# Patient Record
Sex: Female | Born: 1965 | Race: White | Hispanic: No | Marital: Married | Smoking: Never smoker
Health system: Southern US, Community
[De-identification: ages and names within clinical notes are randomized; demographics above are authoritative.]

## PROBLEM LIST (undated history)

## (undated) DIAGNOSIS — Z87442 Personal history of urinary calculi: Secondary | ICD-10-CM

## (undated) DIAGNOSIS — R011 Cardiac murmur, unspecified: Secondary | ICD-10-CM

## (undated) DIAGNOSIS — Z973 Presence of spectacles and contact lenses: Secondary | ICD-10-CM

## (undated) DIAGNOSIS — N201 Calculus of ureter: Secondary | ICD-10-CM

## (undated) DIAGNOSIS — J309 Allergic rhinitis, unspecified: Secondary | ICD-10-CM

## (undated) HISTORY — PX: ACHILLES TENDON LENGTHENING: SUR826

---

## 2003-09-04 ENCOUNTER — Other Ambulatory Visit: Admission: RE | Admit: 2003-09-04 | Discharge: 2003-09-04 | Payer: Self-pay | Admitting: Obstetrics and Gynecology

## 2004-02-21 ENCOUNTER — Other Ambulatory Visit: Admission: RE | Admit: 2004-02-21 | Discharge: 2004-02-21 | Payer: Self-pay | Admitting: Obstetrics and Gynecology

## 2004-07-04 ENCOUNTER — Other Ambulatory Visit: Admission: RE | Admit: 2004-07-04 | Discharge: 2004-07-04 | Payer: Self-pay | Admitting: Obstetrics & Gynecology

## 2004-12-10 ENCOUNTER — Other Ambulatory Visit: Admission: RE | Admit: 2004-12-10 | Discharge: 2004-12-10 | Payer: Self-pay | Admitting: Obstetrics and Gynecology

## 2005-09-02 ENCOUNTER — Ambulatory Visit (HOSPITAL_COMMUNITY): Admission: RE | Admit: 2005-09-02 | Discharge: 2005-09-02 | Payer: Self-pay | Admitting: Obstetrics and Gynecology

## 2005-10-04 ENCOUNTER — Encounter (INDEPENDENT_AMBULATORY_CARE_PROVIDER_SITE_OTHER): Payer: Self-pay | Admitting: *Deleted

## 2005-10-04 ENCOUNTER — Inpatient Hospital Stay (HOSPITAL_COMMUNITY): Admission: AD | Admit: 2005-10-04 | Discharge: 2005-10-08 | Payer: Self-pay | Admitting: Obstetrics and Gynecology

## 2008-01-28 ENCOUNTER — Ambulatory Visit: Payer: Self-pay | Admitting: Internal Medicine

## 2008-01-28 DIAGNOSIS — K219 Gastro-esophageal reflux disease without esophagitis: Secondary | ICD-10-CM

## 2008-01-28 DIAGNOSIS — J309 Allergic rhinitis, unspecified: Secondary | ICD-10-CM | POA: Insufficient documentation

## 2008-01-28 DIAGNOSIS — Z87442 Personal history of urinary calculi: Secondary | ICD-10-CM

## 2008-11-14 ENCOUNTER — Ambulatory Visit (HOSPITAL_COMMUNITY): Admission: RE | Admit: 2008-11-14 | Discharge: 2008-11-14 | Payer: Self-pay | Admitting: Obstetrics and Gynecology

## 2009-03-20 ENCOUNTER — Encounter (INDEPENDENT_AMBULATORY_CARE_PROVIDER_SITE_OTHER): Payer: Self-pay | Admitting: Obstetrics and Gynecology

## 2009-03-20 ENCOUNTER — Inpatient Hospital Stay (HOSPITAL_COMMUNITY): Admission: RE | Admit: 2009-03-20 | Discharge: 2009-03-23 | Payer: Self-pay | Admitting: Obstetrics and Gynecology

## 2009-08-09 ENCOUNTER — Ambulatory Visit: Payer: Self-pay | Admitting: Internal Medicine

## 2009-08-09 DIAGNOSIS — N390 Urinary tract infection, site not specified: Secondary | ICD-10-CM | POA: Insufficient documentation

## 2009-08-09 LAB — CONVERTED CEMR LAB
Glucose, Urine, Semiquant: NEGATIVE
Ketones, urine, test strip: NEGATIVE
Nitrite: NEGATIVE
Protein, U semiquant: 30
Specific Gravity, Urine: 1.02
Urobilinogen, UA: 0.2
pH: 5

## 2009-08-10 ENCOUNTER — Telehealth: Payer: Self-pay | Admitting: Internal Medicine

## 2009-08-10 DIAGNOSIS — R319 Hematuria, unspecified: Secondary | ICD-10-CM | POA: Insufficient documentation

## 2009-08-14 ENCOUNTER — Telehealth: Payer: Self-pay | Admitting: Internal Medicine

## 2009-08-14 ENCOUNTER — Ambulatory Visit: Payer: Self-pay | Admitting: Cardiology

## 2010-02-20 NOTE — Progress Notes (Signed)
Summary: Kidney stone?  Phone Note Call from Patient   Caller: Spouse [phone] Summary of Call: Pt's spouse called stating that since pt has started ABX she has been experiencing severe abd and lower back pain. Spouse is unsure of pain is related to ABX of if pt may have a possible kidney stone. Per spouse pt does not have a fever, no N&V or diarrhea. Pt's husband is requesting MD's advise, change or ABX or kidney stone eval? Initial call taken by: Margaret Pyle, CMA,  August 10, 2009 11:41 AM  Follow-up for Phone Call        stone eval- CT haas been ordered Follow-up by: Etta Grandchild MD,  August 10, 2009 11:43 AM  Additional Follow-up for Phone Call Additional follow up Details #1::        Pt and spouse advised but is still requesting an alternate ABX. Pt states that it is causing GI upset that is making back pain worse. Pt says she has a significant Hx of kidney stones, pt also wanted MD to know that she is using pain meds she had left over from C-Section in 03/2009 (Percocet). [facility] Additional Follow-up by: Margaret Pyle, CMA,  August 10, 2009 11:56 AM  New Problems: HEMATURIA UNSPECIFIED (ICD-599.70)   Additional Follow-up for Phone Call Additional follow up Details #2::    ok Follow-up by: Etta Grandchild MD,  August 10, 2009 12:23 PM  Additional Follow-up for Phone Call Additional follow up Details #3:: Details for Additional Follow-up Action Taken: Spouse informed Additional Follow-up by: Margaret Pyle, CMA,  August 10, 2009 1:44 PM  New Problems: HEMATURIA UNSPECIFIED (ICD-599.70) New/Updated Medications: MACROBID 100 MG CAPS (NITROFURANTOIN MONOHYD MACRO) One by mouth two times a day for 7 days Prescriptions: MACROBID 100 MG CAPS (NITROFURANTOIN MONOHYD MACRO) One by mouth two times a day for 7 days  #14 x 1   Entered and Authorized by:   Etta Grandchild MD   Signed by:   Etta Grandchild MD on 08/10/2009   Method used:   Electronically to          [id number] (retail)       [address]       [place], [place]  [address]       Ph: [phone] or [phone]       Fax: [phone]   RxID:   [id number]

## 2010-02-20 NOTE — Progress Notes (Signed)
Summary: REQ FOR PAIN MED RX - CT RESULTS  Phone Note Call from Patient Call back at Home Phone [phone]   Summary of Call: Patient is requesting rx for pain. Had CT today, please review. Pt had some percocet left over from C-section in March but states that has not given much relief from pain. Pt was given antibiotic on Friday.   Initial call taken by: Lamar Sprinkles, CMA,  August 14, 2009 1:39 PM  Follow-up for Phone Call        Dr Debby Bud informed of CT results. Ok ketoralac 10mg  1 three times a day #15. Pt informed of rx.  Follow-up by: Lamar Sprinkles, CMA,  August 14, 2009 1:59 PM  Additional Follow-up for Phone Call Additional follow up Details #1::        k Additional Follow-up by: Jacques Navy MD,  August 14, 2009 2:21 PM    New/Updated Medications: KETOROLAC TROMETHAMINE 10 MG TABS (KETOROLAC TROMETHAMINE) 1 three times a day as needed pain Prescriptions: KETOROLAC TROMETHAMINE 10 MG TABS (KETOROLAC TROMETHAMINE) 1 three times a day as needed pain  #15 x 0   Entered by:   Lamar Sprinkles, CMA   Authorized by:   Jacques Navy MD   Signed by:   Lamar Sprinkles, CMA on 08/14/2009   Method used:   Electronically to        [id number] (retail)       [address]       [place], [place]  [address]       Ph: [phone] or [phone]       Fax: [phone]   RxID:   [id number]

## 2010-02-20 NOTE — Assessment & Plan Note (Signed)
Summary: BURNING-ITCH WHEN URINATE  STC   Vital Signs:  Patient profile:   45 year old female Height:      63.5 inches Weight:      102 pounds BMI:     17.85 O2 Sat:      98 % on Room air Temp:     97.3 degrees F oral Pulse rate:   99 / minute Pulse rhythm:   regular Resp:     16 per minute BP sitting:   112 / 68  (left arm) Cuff size:   regular  Vitals Entered By: Rock Nephew CMA (August 09, 2009 1:47 PM)  O2 Flow:  Room air CC: urinary burning and itching w/ recent low grade fever x 3days, Dysuria Is Patient Diabetic? No Pain Assessment Patient in pain? no        Primary Care Provider:  Etta Grandchild MD  CC:  urinary burning and itching w/ recent low grade fever x 3days and Dysuria.  History of Present Illness:  Dysuria      This is a 45 year old woman who presents with Dysuria.  The symptoms began 3 days ago.  The intensity is described as moderate.  The patient reports burning with urination, urinary frequency, urgency, and hematuria, but denies vaginal discharge, vaginal itching, and vaginal sores.  Associated symptoms include pelvic pain.  The patient denies the following associated symptoms: nausea, vomiting, fever, shaking chills, flank pain, abdominal pain, back pain, and arthralgias.    Preventive Screening-Counseling & Management  Alcohol-Tobacco     Alcohol drinks/day: 0     Smoking Status: never     Passive Smoke Exposure: no  Hep-HIV-STD-Contraception     Hepatitis Risk: no risk noted     HIV Risk: no risk noted     STD Risk: no risk noted      Drug Use:  no.    Medications Prior to Update: 1)  Folic Acid .... Take 1 Tablet By Mouth Once A Day 2)  Omnaris 50 Mcg/act Susp (Ciclesonide) .... 2 Puffs Each Nostril Once Daily 3)  Xyzal 5 Mg Tabs (Levocetirizine Dihydrochloride) .... Once Daily  Current Medications (verified): 1)  Omnaris 50 Mcg/act Susp (Ciclesonide) .... 2 Puffs Each Nostril Once Daily 2)  Cipro 250 Mg Tab (Ciprofloxacin Hcl)  .... Take 1 Tablet By Mouth Morning and Night X 7 Days  Allergies (verified): 1)  ! Keflex  Past History:  Past Medical History: Last updated: 01/28/2008 Allergic rhinitis Mitral regurg.  murmur GERD Nephrolithiasis, hx of  Family History: Last updated: 01/28/2008 Family History of Arthritis Family History Breast cancer 1st degree relative <50 Family History Hypertension  Social History: Last updated: 01/28/2008 Occupation: Runner, broadcasting/film/video Married Never Smoked Alcohol use-no Drug use-no Regular exercise-yes  Risk Factors: Alcohol Use: 0 (08/09/2009) Exercise: yes (01/28/2008)  Risk Factors: Smoking Status: never (08/09/2009) Passive Smoke Exposure: no (08/09/2009)  Past Surgical History: Caesarean section Tubal ligation  Family History: Reviewed history from 01/28/2008 and no changes required. Family History of Arthritis Family History Breast cancer 1st degree relative <50 Family History Hypertension  Social History: Reviewed history from 01/28/2008 and no changes required. Occupation: Runner, broadcasting/film/video Married Never Smoked Alcohol use-no Drug use-no Regular exercise-yes Hepatitis Risk:  no risk noted HIV Risk:  no risk noted STD Risk:  no risk noted  Review of Systems       The patient complains of hematuria.  The patient denies anorexia, fever, weight loss, chest pain, syncope, suspicious skin lesions, and enlarged lymph  nodes.    Physical Exam  General:  alert, well-developed, well-nourished, and well-hydrated.   Eyes:  vision grossly intact and no injection.   Mouth:  Oral mucosa and oropharynx without lesions or exudates.  Teeth in good repair. Neck:  supple, full ROM, and no masses.   Lungs:  Normal respiratory effort, chest expands symmetrically. Lungs are clear to auscultation, no crackles or wheezes. Heart:  Normal rate and regular rhythm. S1 and S2 normal without gallop, murmur, click, rub or other extra sounds. Abdomen:  soft, non-tender, normal bowel  sounds, no distention, no masses, no guarding, no rigidity, no rebound tenderness, no abdominal hernia, no inguinal hernia, no hepatomegaly, and no splenomegaly.  No CVAT. Msk:  normal ROM, no joint tenderness, no joint swelling, no joint warmth, no redness over joints, no joint deformities, no joint instability, no crepitation, and no muscle atrophy.   Pulses:  R and L carotid,radial,femoral,dorsalis pedis and posterior tibial pulses are full and equal bilaterally Extremities:  No clubbing, cyanosis, edema, or deformity noted with normal full range of motion of all joints.   Neurologic:  No cranial nerve deficits noted. Station and gait are normal. Plantar reflexes are down-going bilaterally. DTRs are symmetrical throughout. Sensory, motor and coordinative functions appear intact. Skin:  turgor normal, color normal, no rashes, no suspicious lesions, no ecchymoses, no petechiae, no purpura, no ulcerations, and no edema.   Cervical Nodes:  no anterior cervical adenopathy and no posterior cervical adenopathy.   Inguinal Nodes:  no R inguinal adenopathy and no L inguinal adenopathy.   Psych:  Cognition and judgment appear intact. Alert and cooperative with normal attention span and concentration. No apparent delusions, illusions, hallucinations   Impression & Recommendations:  Problem # 1:  UTI (ICD-599.0) Assessment New  Her updated medication list for this problem includes:    Cipro 250 Mg Tab (Ciprofloxacin hcl) .[name] Kitchen... Take 1 tablet by mouth morning and night x 7 days  Encouraged to push clear liquids, get enough rest, and take acetaminophen as needed. To be seen in 10 days if no improvement, sooner if worse.  Orders: UA Dipstick w/o Micro (manual) (40981)  Complete Medication List: 1)  Omnaris 50 Mcg/act Susp (Ciclesonide) .... 2 puffs each nostril once daily 2)  Cipro 250 Mg Tab (Ciprofloxacin hcl) .... Take 1 tablet by mouth morning and night x 7 days  Patient Instructions: 1)  Please  schedule a follow-up appointment in 2 weeks. 2)  Take your antibiotic as prescribed until ALL of it is gone, but stop if you develop a rash or swelling and contact our office as soon as possible. 3)  Drink as much fluid as you can tolerate for the next few days. Prescriptions: CIPRO 250 MG TAB (CIPROFLOXACIN HCL) Take 1 tablet by mouth morning and night X 7 days  #14 x 0   Entered and Authorized by:   Etta Grandchild MD   Signed by:   Etta Grandchild MD on 08/09/2009   Method used:   Electronically to        [id number] (retail)       [address]       [place], [place]  [address]       Ph: [phone] or [phone]       Fax: [phone]   RxID:   [id number]   Preventive Care Screening  Pap Smear:    Date:  04/20/2009    Results:  normal   Mammogram:    Date:  03/20/2008    Results:  normal   Last Tetanus Booster:    Date:  01/20/2001    Results:  Historical   Laboratory Results   Urine Tests  Date/Time Received: Rock Nephew CMA  August 09, 2009 2:06 PM   Routine Urinalysis   Color: orange Appearance: Hazy Glucose: negative   (Normal Range: Negative) Bilirubin: negative   (Normal Range: Negative) Ketone: negative   (Normal Range: Negative) Spec. Gravity: 1.020   (Normal Range: 1.003-1.035) Blood: large   (Normal Range: Negative) pH: 5.0   (Normal Range: 5.0-8.0) Protein: 30   (Normal Range: Negative) Urobilinogen: 0.2   (Normal Range: 0-1) Nitrite: negative   (Normal Range: Negative) Leukocyte Esterace: moderate   (Normal Range: Negative)

## 2010-04-14 LAB — CBC
HCT: 29.8 % — ABNORMAL LOW (ref 36.0–46.0)
Hemoglobin: 7.7 g/dL — ABNORMAL LOW (ref 12.0–15.0)
MCHC: 33 g/dL (ref 30.0–36.0)
MCV: 90.3 fL (ref 78.0–100.0)
Platelets: 244 10*3/uL (ref 150–400)
RDW: 14.2 % (ref 11.5–15.5)
RDW: 14.4 % (ref 11.5–15.5)

## 2010-04-14 LAB — CCBB MATERNAL DONOR DRAW

## 2010-06-07 NOTE — Op Note (Signed)
[name], [name]               ACCOUNT NO.:  [id number]   MEDICAL RECORD NO.:  [id number]          PATIENT TYPE:  INP   LOCATION:  9147                          F[facility]ILITY[facility]   PHYSICIAN:  [name], M.D. DATE OF BIRTH:  1965/07/26   DATE OF PROCEDURE:  10/04/2005  DATE OF DISCHARGE:                                 OPERATIVE REPORT   PREOPERATIVE DIAGNOSIS:  Suspect abruption.   POSTOPERATIVE DIAGNOSIS:  Probable abruption.   PROCEDURE:  Primary low transverse cesarean section.   ATTENDING:  Carrington Reynolds, M.D.   ANESTHESIA:  Epidural.   ESTIMATED BLOOD LOSS:  700 mL.   IV FLUIDS:  1000 mL.   URINE OUTPUT:  100 mL.   FINDINGS:  Baby in direct OP presentation with small clots of blood around  the baby's face on entry to the amnion.  There was no evidence of a clotted  surface of the placenta, although there was a small amount of clot and  bleeding in the posterior portion of the uterus close to the cervix.  Baby's  Apgars were 9 and 9.  Cord pH was 7.26.   COMPLICATIONS:  None.   MEDICATIONS:  Pitocin.   PATHOLOGY:  Placenta.   COUNTS:  Correct x3.   REASON FOR OPERATION:  [name] was 39+ weeks and 2 cm dilated and 80%  and desired elective induction at term secondary to increasing pain.  The  patient had not had rupture of membranes and the baby was moving well.  The  patient was admitted to the hospital on Saturday evening and Pitocin was  started after AROM.  At AROM there was port wine bloody fluid noted and I  asked the nurse to keep an eye on this afterwards.  It is unsure if it ever  had cleared, but the fetal heart rates were stable and had good reactivity.  During the course the induction the patient complained of increased pain and  received her epidural, had dropped her blood pressure at that time.  It was  thought to be secondary to the epidural and stabilized with ephedrine.  At  that time bright red blood mixed with port wine fluid  was noted.  This  continued for about an hour.  Later I discussed with the patient the  possibility of doing a cesarean despite the fact that the fetal heart tones  were stable.  The patient desired to wait 30 minutes and 30 minutes later,  the bloody fluid and bleeding continued.  The patient was taken the  operating room for suspected abruption.   TECHNIQUE:  After adequate epidural anesthesia was achieved, the patient was  prepped and draped in the usual sterile fashion in dorsal supine position  with leftward tilt.  A Pfannenstiel skin incision was made with the scalpel  and carried down to the fascia with the Bovie cautery.  The fascia was  incised in the midline with the scalpel and then incised in a transverse  curvilinear manner with the Mayo scissors.  The fascia was reflected  superiorly and inferiorly off the rectus muscles.  The  rectus muscles were  split in the midline.  A bowel-free portion of peritoneum was entered into  and then divided bluntly.  A bladder blade was placed and the vesicouterine  fascia tented up and incised in a transverse curvilinear manner with blunt  dissection, creating the bladder flap.  The bladder blade were placed and a  2 cm incision was made in the upper portion of the lower uterine segment  transversely.  On entry into the amnion, small blood clots were noted around  the baby's face and the incision was extended with the bandage scissors in a  transverse curvilinear manner.  The baby was identified in the vertex  presentation and delivered without complication.  The baby was bulb-  suctioned and the cord was clamped and cut and the baby was handed to a  waiting pediatrics.  Cord gases and cord bloods were obtained and the  placenta was delivered manually.   The uterus was exteriorized, wrapped in a wet lap and cleared of all debris.  The uterine incision was closed with a running locked stitch of 0 Monocryl.  An imbricating layer of 0 Monocryl  was then performed.  An additional figure-  of-eight stitch was used to ensure hemostasis.  The uterus as reapproximated  in the abdomen and had been cleared of all debris with irrigation.  The  uterine incision was reinspected and found to be hemostatic..  The  peritoneum was closed with a running stitch of 2-0 Vicryl.  The fascia was  closed with a running stitch of 0 Vicryl.  The subcutaneous tissue was  rendered hemostatic with Bovie cautery and the skin was closed with staples.  The patient tolerated the procedure well and was returned to the recovery  room in stable condition.   ADDENDUM:  The patient did not receive intraoperative antibiotics as she had  received ampicillin 4 hours before for mitral valve prolapse and she was  allergic to [name].      [name], M.D.  Electronically Signed     MH/MEDQ  D:  10/04/2005  T:  10/06/2005  Job:  [id number]

## 2010-06-07 NOTE — Discharge Summary (Signed)
[name], [name]               ACCOUNT NO.:  [id number]   MEDICAL RECORD NO.:  [id number]          PATIENT TYPE:  INP   LOCATION:  9147                          [facility]ACILITY:  [facility]   PHYSICIAN:  Miguel Aschoff, M.D.       DATE OF BIRTH:  10/18/1965   DATE OF ADMISSION:  10/04/2005  DATE OF DISCHARGE:  10/08/2005                                 DISCHARGE SUMMARY   FINAL DIAGNOSES:  1. Intrauterine pregnancy at [redacted] weeks gestation.  2. Elective induction.  3. Probable abruption.   PROCEDURE:  Primary low transverse cesarean section.  Surgeon Dr. Carrington Clamp.  Complications none.   HISTORY OF PRESENT ILLNESS:  This 45 year old G1, P0 presents at [redacted] weeks  gestation complaining of pain, and she was admitted for an elective  induction.  Her antepartum course up to this point had been complicated by  advanced maternal age of the father of the baby was greater than 50, and the  patient was also 51.  She had a normal quad screen.  Never had  amniocentesis.  The patient also has mitral valve prolapse and does receive  antibiotics during any dental procedures.  The patient is also nonimmune to  rubella, which she will need to be vaccinated for postpartum.   Upon admission, the patient started to have some increased bleeding that was  active and heavier than her period.  Fetal heart tones were still nice and  reactive, and she was about 3 cm dilated at this time.  A discussion was  held with the patient by Dr. Carrington Clamp.  A decision was made to  proceed with a cesarean section secondary to probable abruption.  The  patient was taken to the  operating room on October 04, 2005 by Dr.  Carrington Clamp where a primary low transverse cesarean section was  performed with the delivery of a 6 pound 8 ounces female infant without  Apgars of 9 and 9.  Cord pH was 7.26.  There were some small clots of blood  around the baby's face on entry to the amnion, but there is no evidence of  clot at  the surface of the placenta.   The patient's postoperative course was benign without any significant  fevers.  She was given some antihistamines, nasal steroids for a cold.  She  did have a postoperative anemia that dropped to 9.1 and was started on iron.  She was felt ready for discharge on postoperative day #3.  She was sent home  on a regular diet, told to decrease her activities, told to continue her  prenatal vitamins and iron supplement, was given Percocet 1-2 every 4 hours  as needed for pain, and was to follow up in our office in 2 weeks or sooner  if there was any increased bleeding, pain or problems.   LABORATORIES ON DISCHARGE:  The patient had a hemoglobin of 8.7, white blood  cell count of 11.9 and platelets of 242,000.      Leilani Able, P.A.-C.      Miguel Aschoff, M.D.  Electronically Signed  MB/MEDQ  D:  10/29/2005  T:  10/31/2005  Job:  [id number]

## 2010-11-13 ENCOUNTER — Other Ambulatory Visit: Payer: Self-pay | Admitting: Obstetrics and Gynecology

## 2011-02-07 ENCOUNTER — Ambulatory Visit (INDEPENDENT_AMBULATORY_CARE_PROVIDER_SITE_OTHER): Payer: BC Managed Care – PPO | Admitting: Internal Medicine

## 2011-02-07 ENCOUNTER — Ambulatory Visit (INDEPENDENT_AMBULATORY_CARE_PROVIDER_SITE_OTHER)
Admission: RE | Admit: 2011-02-07 | Discharge: 2011-02-07 | Disposition: A | Discharge status: Home / Self Care | Payer: BC Managed Care – PPO | Source: Ambulatory Visit | Attending: Internal Medicine | Admitting: Internal Medicine

## 2011-02-07 ENCOUNTER — Encounter: Payer: Self-pay | Admitting: Internal Medicine

## 2011-02-07 VITALS — BP 102/64 | HR 80 | Temp 98.3°F | Resp 12 | Wt 99.8 lb

## 2011-02-07 DIAGNOSIS — S59909A Unspecified injury of unspecified elbow, initial encounter: Secondary | ICD-10-CM

## 2011-02-07 DIAGNOSIS — S59919A Unspecified injury of unspecified forearm, initial encounter: Secondary | ICD-10-CM

## 2011-02-07 DIAGNOSIS — S59901A Unspecified injury of right elbow, initial encounter: Secondary | ICD-10-CM | POA: Insufficient documentation

## 2011-02-07 NOTE — Patient Instructions (Signed)
Elbow Injury You or your child has an elbow injury. X-rays and exam today do not show evidence of a fracture (broken bone). That means that only a sling or splint may be required for a brief period of time as directed by your caregiver. HOME CARE INSTRUCTIONS  Only take over-the-counter or prescription medicines for pain, discomfort, or fever as directed by your caregiver.   If you have a splint held on with an elastic wrap or a cast, watch your hand or fingers. If they become numb or cold and blue, loosen the wrap and reapply more loosely. See your caregiver if there is no relief.   You may use ice on your elbow for 15 to 20 minutes, 3 to 4 times per day, for the first 2 to 3 days.   Use your elbow as directed.   See your caregiver as directed. It is very important to keep all follow-up referrals and appointments in order to avoid any long-term problems with your elbow including chronic pain or inability to move the elbow normally.  SEEK IMMEDIATE MEDICAL CARE IF:   There is swelling or increasing pain in your elbow which is not relieved with medications.   You begin to lose feeling in your hand or fingers, or develop swelling of the hand and fingers.   You get a cold or blue hand or fingers on injured side.   If your elbow remains sore, your caregiver may want to x-ray it again. A hairline fracture may not show up on the first x-rays and may only be seen on repeat x-rays ten days to two weeks later. A specialist (radiologist) may examine your x-rays at a later time. In order to get results from the radiologist or their department, make sure you know how and when you are to get that information. It is your responsibility to get results of any tests you may have had.  MAKE SURE YOU:   Understand these instructions.   Will watch your condition.   Will get help right away if you are not doing well or get worse.  Document Released: 03/29/2003 Document Revised: 09/18/2010 Document Reviewed:  08/25/2007 [facility] Patient Information 2012 [place], [place].

## 2011-02-09 NOTE — Progress Notes (Signed)
  Subjective:    Patient ID: [name], female    DOB: Aug 08, 1965, 46 y.o.   MRN: [id number]  HPI She complains that she had a mild crush injury to her right elbow when it got caught in the slats of her baby's bed 4 days ago, she tells me that the pain and swelling has resolved but she is concerned that she has a bruise over the elbow and she wants to get an xray done to see if she has any broken bones. She does not want anything for pain.   Review of Systems  Constitutional: Negative.   HENT: Negative.   Eyes: Negative.   Respiratory: Negative.   Cardiovascular: Negative.   Gastrointestinal: Negative.   Genitourinary: Negative.   Musculoskeletal: Positive for arthralgias (right elbow). Negative for myalgias, back pain, joint swelling and gait problem.  Skin: Negative.   Neurological: Negative.  Negative for weakness and numbness.  Hematological: Negative.   Psychiatric/Behavioral: Negative.        Objective:   Physical Exam  Cardiovascular:  Pulses:      Radial pulses are 1+ on the right side, and 1+ on the left side.  Musculoskeletal:       Right elbow: She exhibits normal range of motion, no swelling, no effusion and no laceration. Deformity: diffuse dark blue/brown ecchymosis over the lateral aspect. no tenderness found. No radial head, no medial epicondyle, no lateral epicondyle and no olecranon process tenderness noted.  Neurological: She is alert. She has normal strength. She displays no atrophy and no tremor. No cranial nerve deficit or sensory deficit. She exhibits normal muscle tone. She displays no seizure activity.  Reflex Scores:      Brachioradialis reflexes are 1+ on the right side and 1+ on the left side.         Assessment & Plan:

## 2011-02-09 NOTE — Assessment & Plan Note (Signed)
Xray is negative for fracture, she will treat with rest/ice/elevation/nsaids

## 2012-10-14 ENCOUNTER — Encounter: Payer: Self-pay | Admitting: Internal Medicine

## 2012-10-14 ENCOUNTER — Ambulatory Visit (INDEPENDENT_AMBULATORY_CARE_PROVIDER_SITE_OTHER): Payer: BC Managed Care – PPO | Admitting: Internal Medicine

## 2012-10-14 VITALS — BP 112/84 | HR 92 | Temp 98.3°F | Ht 63.5 in | Wt 101.0 lb

## 2012-10-14 DIAGNOSIS — J019 Acute sinusitis, unspecified: Secondary | ICD-10-CM | POA: Insufficient documentation

## 2012-10-14 MED ORDER — LEVOFLOXACIN 250 MG PO TABS: 250.0000 mg | ORAL_TABLET | Freq: Every day | ORAL | Status: DC

## 2012-10-14 NOTE — Patient Instructions (Signed)
Please take all new medication as prescribed Please continue all other medications as before, and refills have been done if requested. You can also take Delsym OTC for cough, and/or Mucinex (or it's generic off brand) for congestion, and tylenol as needed for pain.  Please remember to sign up for My Chart if you have not done so, as this will be important to you in the future with finding out test results, communicating by private email, and scheduling acute appointments online when needed.  

## 2012-10-14 NOTE — Progress Notes (Signed)
  Subjective:    Patient ID: [name], female    DOB: 08-Mar-1965, 47 y.o.   MRN: [id number]  HPI   Here with 2-3 days acute onset fever, facial pain, pressure, headache, general weakness and malaise, and greenish d/c, with mild ST and cough, but pt denies chest pain, wheezing, increased sob or doe, orthopnea, PND, increased LE swelling, palpitations, dizziness or syncope. Past Medical History  Diagnosis Date  . ALLERGIC RHINITIS 01/28/2008    Qualifier: Diagnosis of  By: Yetta Barre MD, Bernadene Bell.   [name] Kitchen GERD 01/28/2008    Qualifier: Diagnosis of  By: Yetta Barre MD, Nickola Major, HX OF 01/28/2008    Qualifier: Diagnosis of  By: Yetta Barre MD, Bernadene Bell.    No past surgical history on file.  reports that she has never smoked. She does not have any smokeless tobacco history on file. Her alcohol and drug histories are not on file. family history is not on file. Allergies  Allergen Reactions  . Cephalexin     REACTION: Hives   Current Outpatient Prescriptions on File Prior to Visit  Medication Sig Dispense Refill  . Multiple Vitamin (MULTIVITAMIN) tablet Take 1 tablet by mouth daily.       No current facility-administered medications on file prior to visit.   Review of Systems All otherwise neg per pt     Objective:   Physical Exam BP 112/84  Pulse 92  Temp(Src) 98.3 F (36.8 C) (Oral)  Ht 5' 3.5" (1.613 m)  Wt 101 lb (45.813 kg)  BMI 17.61 kg/m2  SpO2 98% VS noted,  Constitutional: Pt appears well-developed and well-nourished.  HENT: Head: NCAT.  Right Ear: External ear normal.  Left Ear: External ear normal.  Eyes: Conjunctivae and EOM are normal. Pupils are equal, round, and reactive to light.  Neck: Normal range of motion. Neck supple.  Bilat tm's with mild erythema.  Max sinus areas mild tender.  Pharynx with mild erythema, no exudate Cardiovascular: Normal rate and regular rhythm.   Pulmonary/Chest: Effort normal and breath sounds normal.  Neurological: Pt is alert.  Not confused  Skin: Skin is warm. No erythema.  Psychiatric: Pt behavior is normal. Thought content normal.     Assessment & Plan:

## 2012-10-17 ENCOUNTER — Encounter: Payer: Self-pay | Admitting: Internal Medicine

## 2012-10-17 NOTE — Assessment & Plan Note (Signed)
Mild to mod, for antibx course,  to f/u any worsening symptoms or concerns 

## 2012-10-26 ENCOUNTER — Other Ambulatory Visit: Payer: Self-pay | Admitting: Obstetrics and Gynecology

## 2012-11-04 ENCOUNTER — Other Ambulatory Visit: Payer: Self-pay | Admitting: Obstetrics and Gynecology

## 2012-11-04 DIAGNOSIS — N644 Mastodynia: Secondary | ICD-10-CM

## 2012-11-11 LAB — HM MAMMOGRAPHY

## 2012-12-02 ENCOUNTER — Encounter: Payer: Self-pay | Admitting: Internal Medicine

## 2012-12-02 ENCOUNTER — Other Ambulatory Visit (INDEPENDENT_AMBULATORY_CARE_PROVIDER_SITE_OTHER): Payer: BC Managed Care – PPO

## 2012-12-02 ENCOUNTER — Ambulatory Visit (INDEPENDENT_AMBULATORY_CARE_PROVIDER_SITE_OTHER): Payer: BC Managed Care – PPO | Admitting: Internal Medicine

## 2012-12-02 VITALS — BP 110/78 | HR 86 | Temp 97.6°F | Resp 16 | Ht 63.5 in | Wt 99.8 lb

## 2012-12-02 DIAGNOSIS — Z23 Encounter for immunization: Secondary | ICD-10-CM

## 2012-12-02 DIAGNOSIS — Z0000 Encounter for general adult medical examination without abnormal findings: Secondary | ICD-10-CM

## 2012-12-02 LAB — URINALYSIS, ROUTINE W REFLEX MICROSCOPIC
Ketones, ur: NEGATIVE
Leukocytes, UA: NEGATIVE
Specific Gravity, Urine: 1.025 (ref 1.000–1.030)
Urobilinogen, UA: 1 (ref 0.0–1.0)

## 2012-12-02 LAB — LIPID PANEL
HDL: 73.3 mg/dL (ref >39.00)
Total CHOL/HDL Ratio: 2

## 2012-12-02 LAB — COMPREHENSIVE METABOLIC PANEL
Albumin: 4.2 g/dL (ref 3.5–5.2)
Alkaline Phosphatase: 48 U/L (ref 39–117)
BUN: 13 mg/dL (ref 6–23)
CO2: 27 mEq/L (ref 19–32)
Calcium: 9.3 mg/dL (ref 8.4–10.5)
Chloride: 101 mEq/L (ref 96–112)
GFR: 126.26 mL/min (ref >60.00)
Glucose, Bld: 99 mg/dL (ref 70–99)
Potassium: 3.4 mEq/L — ABNORMAL LOW (ref 3.5–5.1)

## 2012-12-02 LAB — TSH: TSH: 0.79 u[IU]/mL (ref 0.35–5.50)

## 2012-12-02 LAB — CBC WITH DIFFERENTIAL/PLATELET
Basophils Relative: 0.6 % (ref 0.0–3.0)
Eosinophils Relative: 1.9 % (ref 0.0–5.0)
Lymphocytes Relative: 25 % (ref 12.0–46.0)
Monocytes Relative: 8.4 % (ref 3.0–12.0)
Neutrophils Relative %: 64.1 % (ref 43.0–77.0)
RBC: 4.34 Mil/uL (ref 3.87–5.11)
WBC: 5 10*3/uL (ref 4.5–10.5)

## 2012-12-02 NOTE — Assessment & Plan Note (Signed)
Exam done Vaccines were updated Labs ordered Pt ed material was given 

## 2012-12-02 NOTE — Progress Notes (Signed)
Pre-visit discussion using our clinic review tool. No additional management support is needed unless otherwise documented below in the visit note.  

## 2012-12-02 NOTE — Patient Instructions (Signed)
Preventive Care for Adults, Female A healthy lifestyle and preventive care can promote health and wellness. Preventive health guidelines for women include the following key practices.  A routine yearly physical is a good way to check with your caregiver about your health and preventive screening. It is a chance to share any concerns and updates on your health, and to receive a thorough exam.  Visit your dentist for a routine exam and preventive care every 6 months. Brush your teeth twice a day and floss once a day. Good oral hygiene prevents tooth decay and gum disease.  The frequency of eye exams is based on your age, health, family medical history, use of contact lenses, and other factors. Follow your caregiver's recommendations for frequency of eye exams.  Eat a healthy diet. Foods like vegetables, fruits, whole grains, low-fat dairy products, and lean protein foods contain the nutrients you need without too many calories. Decrease your intake of foods high in solid fats, added sugars, and salt. Eat the right amount of calories for you.Get information about a proper diet from your caregiver, if necessary.  Regular physical exercise is one of the most important things you can do for your health. Most adults should get at least 150 minutes of moderate-intensity exercise (any activity that increases your heart rate and causes you to sweat) each week. In addition, most adults need muscle-strengthening exercises on 2 or more days a week.  Maintain a healthy weight. The body mass index (BMI) is a screening tool to identify possible weight problems. It provides an estimate of body fat based on height and weight. Your caregiver can help determine your BMI, and can help you achieve or maintain a healthy weight.For adults 20 years and older:  A BMI below 18.5 is considered underweight.  A BMI of 18.5 to 24.9 is normal.  A BMI of 25 to 29.9 is considered overweight.  A BMI of 30 and above is  considered obese.  Maintain normal blood lipids and cholesterol levels by exercising and minimizing your intake of saturated fat. Eat a balanced diet with plenty of fruit and vegetables. Blood tests for lipids and cholesterol should begin at age 20 and be repeated every 5 years. If your lipid or cholesterol levels are high, you are over 50, or you are at high risk for heart disease, you may need your cholesterol levels checked more frequently.Ongoing high lipid and cholesterol levels should be treated with medicines if diet and exercise are not effective.  If you smoke, find out from your caregiver how to quit. If you do not use tobacco, do not start.  Lung cancer screening is recommended for adults aged 55 80 years who are at high risk for developing lung cancer because of a history of smoking. Yearly low-dose computed tomography (CT) is recommended for people who have at least a 30-pack-year history of smoking and are a current smoker or have quit within the past 15 years. A pack year of smoking is smoking an average of 1 pack of cigarettes a day for 1 year (for example: 1 pack a day for 30 years or 2 packs a day for 15 years). Yearly screening should continue until the smoker has stopped smoking for at least 15 years. Yearly screening should also be stopped for people who develop a health problem that would prevent them from having lung cancer treatment.  If you are pregnant, do not drink alcohol. If you are breastfeeding, be very cautious about drinking alcohol. If you are   not pregnant and choose to drink alcohol, do not exceed 1 drink per day. One drink is considered to be 12 ounces (355 mL) of beer, 5 ounces (148 mL) of wine, or 1.5 ounces (44 mL) of liquor.  Avoid use of street drugs. Do not share needles with anyone. Ask for help if you need support or instructions about stopping the use of drugs.  High blood pressure causes heart disease and increases the risk of stroke. Your blood pressure  should be checked at least every 1 to 2 years. Ongoing high blood pressure should be treated with medicines if weight loss and exercise are not effective.  If you are 55 to 46 years old, ask your caregiver if you should take aspirin to prevent strokes.  Diabetes screening involves taking a blood sample to check your fasting blood sugar level. This should be done once every 3 years, after age 45, if you are within normal weight and without risk factors for diabetes. Testing should be considered at a younger age or be carried out more frequently if you are overweight and have at least 1 risk factor for diabetes.  Breast cancer screening is essential preventive care for women. You should practice "breast self-awareness." This means understanding the normal appearance and feel of your breasts and may include breast self-examination. Any changes detected, no matter how small, should be reported to a caregiver. Women in their 20s and 30s should have a clinical breast exam (CBE) by a caregiver as part of a regular health exam every 1 to 3 years. After age 40, women should have a CBE every year. Starting at age 40, women should consider having a mammography (breast X-ray test) every year. Women who have a family history of breast cancer should talk to their caregiver about genetic screening. Women at a high risk of breast cancer should talk to their caregivers about having magnetic resonance imaging (MRI) and a mammography every year.  Breast cancer gene (BRCA)-related cancer risk assessment is recommended for women who have family members with BRCA-related cancers. BRCA-related cancers include breast, ovarian, tubal, and peritoneal cancers. Having family members with these cancers may be associated with an increased risk for harmful changes (mutations) in the breast cancer genes BRCA1 and BRCA2. Results of the assessment will determine the need for genetic counseling and BRCA1 and BRCA2 testing.  The Pap test is  a screening test for cervical cancer. A Pap test can show cell changes on the cervix that might become cervical cancer if left untreated. A Pap test is a procedure in which cells are obtained and examined from the lower end of the uterus (cervix).  Women should have a Pap test starting at age 21.  Between ages 21 and 29, Pap tests should be repeated every 2 years.  Beginning at age 30, you should have a Pap test every 3 years as long as the past 3 Pap tests have been normal.  Some women have medical problems that increase the chance of getting cervical cancer. Talk to your caregiver about these problems. It is especially important to talk to your caregiver if a new problem develops soon after your last Pap test. In these cases, your caregiver may recommend more frequent screening and Pap tests.  The above recommendations are the same for women who have or have not gotten the vaccine for human papillomavirus (HPV).  If you had a hysterectomy for a problem that was not cancer or a condition that could lead to cancer, then   you no longer need Pap tests. Even if you no longer need a Pap test, a regular exam is a good idea to make sure no other problems are starting.  If you are between ages 65 and 70, and you have had normal Pap tests going back 10 years, you no longer need Pap tests. Even if you no longer need a Pap test, a regular exam is a good idea to make sure no other problems are starting.  If you have had past treatment for cervical cancer or a condition that could lead to cancer, you need Pap tests and screening for cancer for at least 20 years after your treatment.  If Pap tests have been discontinued, risk factors (such as a new sexual partner) need to be reassessed to determine if screening should be resumed.  The HPV test is an additional test that may be used for cervical cancer screening. The HPV test looks for the virus that can cause the cell changes on the cervix. The cells collected  during the Pap test can be tested for HPV. The HPV test could be used to screen women aged 30 years and older, and should be used in women of any age who have unclear Pap test results. After the age of 30, women should have HPV testing at the same frequency as a Pap test.  Colorectal cancer can be detected and often prevented. Most routine colorectal cancer screening begins at the age of 50 and continues through age 75. However, your caregiver may recommend screening at an earlier age if you have risk factors for colon cancer. On a yearly basis, your caregiver may provide home test kits to check for hidden blood in the stool. Use of a small camera at the end of a tube, to directly examine the colon (sigmoidoscopy or colonoscopy), can detect the earliest forms of colorectal cancer. Talk to your caregiver about this at age 50, when routine screening begins. Direct examination of the colon should be repeated every 5 to 10 years through age 75, unless early forms of pre-cancerous polyps or small growths are found.  Hepatitis C blood testing is recommended for all people born from 1945 through 1965 and any individual with known risks for hepatitis C.  Practice safe sex. Use condoms and avoid high-risk sexual practices to reduce the spread of sexually transmitted infections (STIs). STIs include gonorrhea, chlamydia, syphilis, trichomonas, herpes, HPV, and human immunodeficiency virus (HIV). Herpes, HIV, and HPV are viral illnesses that have no cure. They can result in disability, cancer, and death. Sexually active women aged 25 and younger should be checked for chlamydia. Older women with new or multiple partners should also be tested for chlamydia. Testing for other STIs is recommended if you are sexually active and at increased risk.  Osteoporosis is a disease in which the bones lose minerals and strength with aging. This can result in serious bone fractures. The risk of osteoporosis can be identified using a  bone density scan. Women ages 65 and over and women at risk for fractures or osteoporosis should discuss screening with their caregivers. Ask your caregiver whether you should take a calcium supplement or vitamin D to reduce the rate of osteoporosis.  Menopause can be associated with physical symptoms and risks. Hormone replacement therapy is available to decrease symptoms and risks. You should talk to your caregiver about whether hormone replacement therapy is right for you.  Use sunscreen. Apply sunscreen liberally and repeatedly throughout the day. You should seek shade   when your shadow is shorter than you. Protect yourself by wearing long sleeves, pants, a wide-brimmed hat, and sunglasses year round, whenever you are outdoors.  Once a month, do a whole body skin exam, using a mirror to look at the skin on your back. Notify your caregiver of new moles, moles that have irregular borders, moles that are larger than a pencil eraser, or moles that have changed in shape or color.  Stay current with required immunizations.  Influenza vaccine. All adults should be immunized every year.  Tetanus, diphtheria, and acellular pertussis (Td, Tdap) vaccine. Pregnant women should receive 1 dose of Tdap vaccine during each pregnancy. The dose should be obtained regardless of the length of time since the last dose. Immunization is preferred during the 27th to 36th week of gestation. An adult who has not previously received Tdap or who does not know her vaccine status should receive 1 dose of Tdap. This initial dose should be followed by tetanus and diphtheria toxoids (Td) booster doses every 10 years. Adults with an unknown or incomplete history of completing a 3-dose immunization series with Td-containing vaccines should begin or complete a primary immunization series including a Tdap dose. Adults should receive a Td booster every 10 years.  Varicella vaccine. An adult without evidence of immunity to varicella  should receive 2 doses or a second dose if she has previously received 1 dose. Pregnant females who do not have evidence of immunity should receive the first dose after pregnancy. This first dose should be obtained before leaving the health care facility. The second dose should be obtained 4 8 weeks after the first dose.  Human papillomavirus (HPV) vaccine. Females aged 13 26 years who have not received the vaccine previously should obtain the 3-dose series. The vaccine is not recommended for use in pregnant females. However, pregnancy testing is not needed before receiving a dose. If a female is found to be pregnant after receiving a dose, no treatment is needed. In that case, the remaining doses should be delayed until after the pregnancy. Immunization is recommended for any person with an immunocompromised condition through the age of 26 years if she did not get any or all doses earlier. During the 3-dose series, the second dose should be obtained 4 8 weeks after the first dose. The third dose should be obtained 24 weeks after the first dose and 16 weeks after the second dose.  Zoster vaccine. One dose is recommended for adults aged 60 years or older unless certain conditions are present.  Measles, mumps, and rubella (MMR) vaccine. Adults born before 1957 generally are considered immune to measles and mumps. Adults born in 1957 or later should have 1 or more doses of MMR vaccine unless there is a contraindication to the vaccine or there is laboratory evidence of immunity to each of the three diseases. A routine second dose of MMR vaccine should be obtained at least 28 days after the first dose for students attending postsecondary schools, health care workers, or international travelers. People who received inactivated measles vaccine or an unknown type of measles vaccine during 1963 1967 should receive 2 doses of MMR vaccine. People who received inactivated mumps vaccine or an unknown type of mumps vaccine  before 1979 and are at high risk for mumps infection should consider immunization with 2 doses of MMR vaccine. For females of childbearing age, rubella immunity should be determined. If there is no evidence of immunity, females who are not pregnant should be vaccinated. If there   is no evidence of immunity, females who are pregnant should delay immunization until after pregnancy. Unvaccinated health care workers born before 1957 who lack laboratory evidence of measles, mumps, or rubella immunity or laboratory confirmation of disease should consider measles and mumps immunization with 2 doses of MMR vaccine or rubella immunization with 1 dose of MMR vaccine.  Pneumococcal 13-valent conjugate (PCV13) vaccine. When indicated, a person who is uncertain of her immunization history and has no record of immunization should receive the PCV13 vaccine. An adult aged 19 years or older who has certain medical conditions and has not been previously immunized should receive 1 dose of PCV13 vaccine. This PCV13 should be followed with a dose of pneumococcal polysaccharide (PPSV23) vaccine. The PPSV23 vaccine dose should be obtained at least 8 weeks after the dose of PCV13 vaccine. An adult aged 19 years or older who has certain medical conditions and previously received 1 or more doses of PPSV23 vaccine should receive 1 dose of PCV13. The PCV13 vaccine dose should be obtained 1 or more years after the last PPSV23 vaccine dose.  Pneumococcal polysaccharide (PPSV23) vaccine. When PCV13 is also indicated, PCV13 should be obtained first. All adults aged 65 years and older should be immunized. An adult younger than age 65 years who has certain medical conditions should be immunized. Any person who resides in a nursing home or long-term care facility should be immunized. An adult smoker should be immunized. People with an immunocompromised condition and certain other conditions should receive both PCV13 and PPSV23 vaccines. People  with human immunodeficiency virus (HIV) infection should be immunized as soon as possible after diagnosis. Immunization during chemotherapy or radiation therapy should be avoided. Routine use of PPSV23 vaccine is not recommended for American Indians, [place] Natives, or people younger than 65 years unless there are medical conditions that require PPSV23 vaccine. When indicated, people who have unknown immunization and have no record of immunization should receive PPSV23 vaccine. One-time revaccination 5 years after the first dose of PPSV23 is recommended for people aged 19 64 years who have chronic kidney failure, nephrotic syndrome, asplenia, or immunocompromised conditions. People who received 1 2 doses of PPSV23 before age 65 years should receive another dose of PPSV23 vaccine at age 65 years or later if at least 5 years have passed since the previous dose. Doses of PPSV23 are not needed for people immunized with PPSV23 at or after age 65 years.  Meningococcal vaccine. Adults with asplenia or persistent complement component deficiencies should receive 2 doses of quadrivalent meningococcal conjugate (MenACWY-D) vaccine. The doses should be obtained at least 2 months apart. Microbiologists working with certain meningococcal bacteria, military recruits, people at risk during an outbreak, and people who travel to or live in countries with a high rate of meningitis should be immunized. A first-year college student up through age 21 years who is living in a residence hall should receive a dose if she did not receive a dose on or after her 16th birthday. Adults who have certain high-risk conditions should receive one or more doses of vaccine.  Hepatitis A vaccine. Adults who wish to be protected from this disease, have certain high-risk conditions, work with hepatitis A-infected animals, work in hepatitis A research labs, or travel to or work in countries with a high rate of hepatitis A should be immunized. Adults  who were previously unvaccinated and who anticipate close contact with an international adoptee during the first 60 days after arrival in the [place] from a country   with a high rate of hepatitis A should be immunized.  Hepatitis B vaccine. Adults who wish to be protected from this disease, have certain high-risk conditions, may be exposed to blood or other infectious body fluids, are household contacts or sex partners of hepatitis B positive people, are clients or workers in certain care facilities, or travel to or work in countries with a high rate of hepatitis B should be immunized.  Haemophilus influenzae type b (Hib) vaccine. A previously unvaccinated person with asplenia or sickle cell disease or having a scheduled splenectomy should receive 1 dose of Hib vaccine. Regardless of previous immunization, a recipient of a hematopoietic stem cell transplant should receive a 3-dose series 6 12 months after her successful transplant. Hib vaccine is not recommended for adults with HIV infection. Preventive Services / Frequency Ages 19 to 39  Blood pressure check.** / Every 1 to 2 years.  Lipid and cholesterol check.** / Every 5 years beginning at age 20.  Clinical breast exam.** / Every 3 years for women in their 20s and 30s.  BRCA-related cancer risk assessment.** / For women who have family members with a BRCA-related cancer (breast, ovarian, tubal, or peritoneal cancers).  Pap test.** / Every 2 years from ages 21 through 29. Every 3 years starting at age 30 through age 65 or 70 with a history of 3 consecutive normal Pap tests.  HPV screening.** / Every 3 years from ages 30 through ages 65 to 70 with a history of 3 consecutive normal Pap tests.  Hepatitis C blood test.** / For any individual with known risks for hepatitis C.  Skin self-exam. / Monthly.  Influenza vaccine. / Every year.  Tetanus, diphtheria, and acellular pertussis (Tdap, Td) vaccine.** / Consult your caregiver. Pregnant  women should receive 1 dose of Tdap vaccine during each pregnancy. 1 dose of Td every 10 years.  Varicella vaccine.** / Consult your caregiver. Pregnant females who do not have evidence of immunity should receive the first dose after pregnancy.  HPV vaccine. / 3 doses over 6 months, if 26 and younger. The vaccine is not recommended for use in pregnant females. However, pregnancy testing is not needed before receiving a dose.  Measles, mumps, rubella (MMR) vaccine.** / You need at least 1 dose of MMR if you were born in 1957 or later. You may also need a 2nd dose. For females of childbearing age, rubella immunity should be determined. If there is no evidence of immunity, females who are not pregnant should be vaccinated. If there is no evidence of immunity, females who are pregnant should delay immunization until after pregnancy.  Pneumococcal 13-valent conjugate (PCV13) vaccine.** / Consult your caregiver.  Pneumococcal polysaccharide (PPSV23) vaccine.** / 1 to 2 doses if you smoke cigarettes or if you have certain conditions.  Meningococcal vaccine.** / 1 dose if you are age 19 to 21 years and a first-year college student living in a residence hall, or have one of several medical conditions, you need to get vaccinated against meningococcal disease. You may also need additional booster doses.  Hepatitis A vaccine.** / Consult your caregiver.  Hepatitis B vaccine.** / Consult your caregiver.  Haemophilus influenzae type b (Hib) vaccine.** / Consult your caregiver. Ages 40 to 64  Blood pressure check.** / Every 1 to 2 years.  Lipid and cholesterol check.** / Every 5 years beginning at age 20.  Lung cancer screening. / Every year if you are aged 55 80 years and have a 30-pack-year history of smoking and   currently smoke or have quit within the past 15 years. Yearly screening is stopped once you have quit smoking for at least 15 years or develop a health problem that would prevent you from having  lung cancer treatment.  Clinical breast exam.** / Every year after age 40.  BRCA-related cancer risk assessment.** / For women who have family members with a BRCA-related cancer (breast, ovarian, tubal, or peritoneal cancers).  Mammogram.** / Every year beginning at age 40 and continuing for as long as you are in good health. Consult with your caregiver.  Pap test.** / Every 3 years starting at age 30 through age 65 or 70 with a history of 3 consecutive normal Pap tests.  HPV screening.** / Every 3 years from ages 30 through ages 65 to 70 with a history of 3 consecutive normal Pap tests.  Fecal occult blood test (FOBT) of stool. / Every year beginning at age 50 and continuing until age 75. You may not need to do this test if you get a colonoscopy every 10 years.  Flexible sigmoidoscopy or colonoscopy.** / Every 5 years for a flexible sigmoidoscopy or every 10 years for a colonoscopy beginning at age 50 and continuing until age 75.  Hepatitis C blood test.** / For all people born from 1945 through 1965 and any individual with known risks for hepatitis C.  Skin self-exam. / Monthly.  Influenza vaccine. / Every year.  Tetanus, diphtheria, and acellular pertussis (Tdap/Td) vaccine.** / Consult your caregiver. Pregnant women should receive 1 dose of Tdap vaccine during each pregnancy. 1 dose of Td every 10 years.  Varicella vaccine.** / Consult your caregiver. Pregnant females who do not have evidence of immunity should receive the first dose after pregnancy.  Zoster vaccine.** / 1 dose for adults aged 60 years or older.  Measles, mumps, rubella (MMR) vaccine.** / You need at least 1 dose of MMR if you were born in 1957 or later. You may also need a 2nd dose. For females of childbearing age, rubella immunity should be determined. If there is no evidence of immunity, females who are not pregnant should be vaccinated. If there is no evidence of immunity, females who are pregnant should delay  immunization until after pregnancy.  Pneumococcal 13-valent conjugate (PCV13) vaccine.** / Consult your caregiver.  Pneumococcal polysaccharide (PPSV23) vaccine.** / 1 to 2 doses if you smoke cigarettes or if you have certain conditions.  Meningococcal vaccine.** / Consult your caregiver.  Hepatitis A vaccine.** / Consult your caregiver.  Hepatitis B vaccine.** / Consult your caregiver.  Haemophilus influenzae type b (Hib) vaccine.** / Consult your caregiver. Ages 65 and over  Blood pressure check.** / Every 1 to 2 years.  Lipid and cholesterol check.** / Every 5 years beginning at age 20.  Lung cancer screening. / Every year if you are aged 55 80 years and have a 30-pack-year history of smoking and currently smoke or have quit within the past 15 years. Yearly screening is stopped once you have quit smoking for at least 15 years or develop a health problem that would prevent you from having lung cancer treatment.  Clinical breast exam.** / Every year after age 40.  BRCA-related cancer risk assessment.** / For women who have family members with a BRCA-related cancer (breast, ovarian, tubal, or peritoneal cancers).  Mammogram.** / Every year beginning at age 40 and continuing for as long as you are in good health. Consult with your caregiver.  Pap test.** / Every 3 years starting at age   30 through age 65 or 70 with a 3 consecutive normal Pap tests. Testing can be stopped between 65 and 70 with 3 consecutive normal Pap tests and no abnormal Pap or HPV tests in the past 10 years.  HPV screening.** / Every 3 years from ages 30 through ages 65 or 70 with a history of 3 consecutive normal Pap tests. Testing can be stopped between 65 and 70 with 3 consecutive normal Pap tests and no abnormal Pap or HPV tests in the past 10 years.  Fecal occult blood test (FOBT) of stool. / Every year beginning at age 50 and continuing until age 75. You may not need to do this test if you get a colonoscopy  every 10 years.  Flexible sigmoidoscopy or colonoscopy.** / Every 5 years for a flexible sigmoidoscopy or every 10 years for a colonoscopy beginning at age 50 and continuing until age 75.  Hepatitis C blood test.** / For all people born from 1945 through 1965 and any individual with known risks for hepatitis C.  Osteoporosis screening.** / A one-time screening for women ages 65 and over and women at risk for fractures or osteoporosis.  Skin self-exam. / Monthly.  Influenza vaccine. / Every year.  Tetanus, diphtheria, and acellular pertussis (Tdap/Td) vaccine.** / 1 dose of Td every 10 years.  Varicella vaccine.** / Consult your caregiver.  Zoster vaccine.** / 1 dose for adults aged 60 years or older.  Pneumococcal 13-valent conjugate (PCV13) vaccine.** / Consult your caregiver.  Pneumococcal polysaccharide (PPSV23) vaccine.** / 1 dose for all adults aged 65 years and older.  Meningococcal vaccine.** / Consult your caregiver.  Hepatitis A vaccine.** / Consult your caregiver.  Hepatitis B vaccine.** / Consult your caregiver.  Haemophilus influenzae type b (Hib) vaccine.** / Consult your caregiver. ** Family history and personal history of risk and conditions may change your caregiver's recommendations. Document Released: 03/04/2001 Document Revised: 05/03/2012 Document Reviewed: 06/03/2010 [employer] Patient Information 2014 [employer].  

## 2012-12-02 NOTE — Progress Notes (Signed)
  Subjective:    Patient ID: [name], female    DOB: [birth year], 47 y.o.   MRN: [id number]  HPI  She returns for a physical and she tells me that she feels well and offers no complaints.  Review of Systems  Constitutional: Negative.  Negative for fever, chills, diaphoresis, appetite change and fatigue.  HENT: Negative.   Eyes: Negative.   Respiratory: Negative.  Negative for apnea, cough, choking, chest tightness, shortness of breath, wheezing and stridor.   Cardiovascular: Negative.  Negative for chest pain, palpitations and leg swelling.  Gastrointestinal: Negative.  Negative for nausea, vomiting, diarrhea, constipation and blood in stool.  Endocrine: Negative.   Genitourinary: Negative.   Musculoskeletal: Negative.   Skin: Negative.   Neurological: Negative.  Negative for dizziness, tremors, weakness and light-headedness.  Hematological: Negative.  Negative for adenopathy. Does not bruise/bleed easily.  Psychiatric/Behavioral: Negative.        Objective:   Physical Exam  Vitals reviewed. Constitutional: She is oriented to person, place, and time. She appears well-developed and well-nourished. No distress.  HENT:  Head: Normocephalic and atraumatic.  Mouth/Throat: Oropharynx is clear and moist. No oropharyngeal exudate.  Eyes: Conjunctivae are normal. Right eye exhibits no discharge. Left eye exhibits no discharge. No scleral icterus.  Neck: Normal range of motion. Neck supple. No JVD present. No tracheal deviation present. No thyromegaly present.  Cardiovascular: Normal rate, regular rhythm, normal heart sounds and intact distal pulses.  Exam reveals no gallop and no friction rub.   No murmur heard. Pulmonary/Chest: Effort normal and breath sounds normal. No stridor. No respiratory distress. She has no wheezes. She has no rales. She exhibits no tenderness.  Abdominal: Soft. Bowel sounds are normal. She exhibits no distension and no mass. There is no tenderness. There is  no rebound and no guarding.  Musculoskeletal: Normal range of motion. She exhibits no edema and no tenderness.  Lymphadenopathy:    She has no cervical adenopathy.  Neurological: She is oriented to person, place, and time.  Skin: Skin is warm and dry. No rash noted. She is not diaphoretic. No erythema. No pallor.  Psychiatric: She has a normal mood and affect. Her behavior is normal. Judgment and thought content normal.     Lab Results  Component Value Date   WBC 12.4* 03/21/2009   HGB 7.7 DELTA CHECK NOTED RESULT REPEATED AND VERIFIED* 03/21/2009   HCT 23.5* 03/21/2009   PLT 245 03/21/2009        Assessment & Plan:

## 2012-12-03 ENCOUNTER — Encounter: Payer: Self-pay | Admitting: Internal Medicine

## 2012-12-06 ENCOUNTER — Other Ambulatory Visit: Payer: Self-pay | Admitting: Obstetrics and Gynecology

## 2012-12-06 ENCOUNTER — Ambulatory Visit
Admission: RE | Admit: 2012-12-06 | Discharge: 2012-12-06 | Disposition: A | Discharge status: Home / Self Care | Payer: BC Managed Care – PPO | Source: Ambulatory Visit | Attending: Obstetrics and Gynecology | Admitting: Obstetrics and Gynecology

## 2012-12-06 DIAGNOSIS — N644 Mastodynia: Secondary | ICD-10-CM

## 2013-05-24 ENCOUNTER — Encounter: Payer: Self-pay | Admitting: Internal Medicine

## 2013-05-24 ENCOUNTER — Ambulatory Visit (INDEPENDENT_AMBULATORY_CARE_PROVIDER_SITE_OTHER): Payer: BC Managed Care – PPO | Admitting: Internal Medicine

## 2013-05-24 VITALS — BP 130/100 | HR 77 | Temp 98.1°F | Resp 14 | Wt 98.2 lb

## 2013-05-24 DIAGNOSIS — R030 Elevated blood-pressure reading, without diagnosis of hypertension: Secondary | ICD-10-CM

## 2013-05-24 DIAGNOSIS — J209 Acute bronchitis, unspecified: Secondary | ICD-10-CM

## 2013-05-24 MED ORDER — HYDROCODONE-HOMATROPINE 5-1.5 MG/5ML PO SYRP: 5.0000 mL | ORAL_SOLUTION | Freq: Four times a day (QID) | ORAL | Status: DC | PRN

## 2013-05-24 MED ORDER — AZITHROMYCIN 250 MG PO TABS: ORAL_TABLET | ORAL | Status: DC

## 2013-05-24 NOTE — Patient Instructions (Signed)
Plain Mucinex (NOT D) for thick secretions ;force NON dairy fluids .   Nasal cleansing in the shower as discussed with lather of mild shampoo.After 10 seconds wash off lather while  exhaling through nostrils. Make sure that all residual soap is removed to prevent irritation.  Flonase OR Nasacort AQ 1 spray in each nostril twice a day as needed. Use the "crossover" technique into opposite nostril spraying toward opposite ear @ 45 degree angle, not straight up into nostril.  Use a Neti pot daily only  as needed for significant sinus congestion; going from open side to congested side . Plain Allegra (NOT D )  160 daily , Loratidine 10 mg , OR Zyrtec 10 mg @ bedtime  as needed for itchy eyes & sneezing.   Minimal Blood Pressure Goal= AVERAGE < 140/90;  Ideal is an AVERAGE < 135/85. This AVERAGE should be calculated from @ least 5-7 BP readings taken @ different times of day on different days of week. You should not respond to isolated BP readings , but rather the AVERAGE for that week .Please bring your  blood pressure cuff to office visits to verify that it is reliable.It  can also be checked against the blood pressure device at the pharmacy. Finger or wrist cuffs are not dependable; an arm cuff is. 

## 2013-05-24 NOTE — Progress Notes (Signed)
   Subjective:    Patient ID: [name], female    DOB: 1965/11/19, 48 y.o.   MRN: [id number]  HPI   Symptoms began 05/15/13 as sore throat, laryngitis, rhinitis, nonproductive cough, and extrinsic symptoms of itchy, watery eyes and sneezing. Pollen may have been a trigger.  She took Delsym as well as over-the-counter day and nighttime decongestant products. (See blood pressure)  She continues to have some facial pain, sore throat, nasal purulence, extrinsic symptoms, and cough with discolored secretions. There is a greater volume of sputum ithan of nasal discharge   Review of Systems  She specifically denies frontal headache, dental pain, earache, otic discharge, wheezing, shortness of breath, fever, chills, or sweats.       Objective:   Physical Exam   General appearance:Thin but in good health ;well nourished; no acute distress or increased work of breathing is present.  No  lymphadenopathy about the head, neck, or axilla noted.   Eyes: No conjunctival inflammation or lid edema is present. There is no scleral icterus.  Ears:  External ear exam shows no significant lesions or deformities.  Otoscopic examination reveals clear canals, tympanic membranes are intact bilaterally without bulging, retraction, inflammation or discharge.  Nose:  External nasal examination shows no deformity or inflammation. Nasal mucosa are pink and moist without lesions or exudates. No septal dislocation or deviation.No obstruction to airflow.   Oral exam: Dental hygiene is good; lips and gums are healthy appearing.There is no oropharyngeal erythema or exudate noted.   Neck:  No deformities,  masses, or tenderness noted.     Heart:  Normal rate and regular rhythm. S1 and S2 normal without gallop, murmur, click, rub or other extra sounds.   Lungs:Chest clear to auscultation; no wheezes, rhonchi,rales ,or rubs present.No increased work of breathing.    Extremities:  No cyanosis, edema, or clubbing   noted  There is reverse curvature of the lumbosacral spine which is reversible with posture change.   Skin: Warm & dry          Assessment & Plan:  #1 acute bronchitis w/o bronchospasm #2 URI, acute #3 increased BP from decongestants Plan: See orders and recommendations

## 2013-05-24 NOTE — Progress Notes (Signed)
Pre visit review using our clinic review tool, if applicable. No additional management support is needed unless otherwise documented below in the visit note. 

## 2013-07-26 ENCOUNTER — Other Ambulatory Visit: Payer: Self-pay | Admitting: Dermatology

## 2013-09-16 ENCOUNTER — Ambulatory Visit (INDEPENDENT_AMBULATORY_CARE_PROVIDER_SITE_OTHER): Payer: BC Managed Care – PPO | Admitting: Internal Medicine

## 2013-09-16 ENCOUNTER — Encounter: Payer: Self-pay | Admitting: Internal Medicine

## 2013-09-16 VITALS — BP 130/90 | HR 82 | Temp 98.2°F | Wt 98.2 lb

## 2013-09-16 DIAGNOSIS — J029 Acute pharyngitis, unspecified: Secondary | ICD-10-CM

## 2013-09-16 DIAGNOSIS — J310 Chronic rhinitis: Secondary | ICD-10-CM

## 2013-09-16 DIAGNOSIS — R509 Fever, unspecified: Secondary | ICD-10-CM

## 2013-09-16 NOTE — Progress Notes (Signed)
   Subjective:    Patient ID: [name], female    DOB: Mar 28, 1965, 48 y.o.   MRN: [id number]  HPI   She began to have rhinitis & head congestion 09/13/13. Yesterday she had a sore throat. This has resolved except for residual itchiness.  Today she describes a nonproductive cough.  She's had some sneezing.  Her temperature was as high as 99.7 yesterday.  Her daughter has had a viral respiratory infection.  At this time she describes frontal headache and facial pain without nasal purulence. She also has pain in the teeth.    Review of Systems  She denies itchy, watery eyes  No sputum production, shortness of breath, or wheezing.     Objective:   Physical Exam  Nasal mucosa is mildly erythematous and dry. Otherwise exam is unremarkable.  General appearance:thin but in good health ;well nourished; no acute distress or increased work of breathing is present.  No  lymphadenopathy about the head, neck, or axilla noted.   Eyes: No conjunctival inflammation or lid edema is present. There is no scleral icterus.  Ears:  External ear exam shows no significant lesions or deformities.  Otoscopic examination reveals clear canals, tympanic membranes are intact bilaterally without bulging, retraction, inflammation or discharge.  Nose:  External nasal examination shows no deformity or inflammation. No septal dislocation or deviation.No obstruction to airflow.   Oral exam: Dental hygiene is good; lips and gums are healthy appearing.There is no oropharyngeal erythema or exudate noted.   Neck:  No deformities, thyromegaly, masses, or tenderness noted.   Supple with full range of motion without pain.   Heart:  Normal rate and regular rhythm. S1 and S2 normal without gallop, murmur, click, rub or other extra sounds.   Lungs:Chest clear to auscultation; no wheezes, rhonchi,rales ,or rubs present.No increased work of breathing.    Extremities:  No cyanosis, edema, or clubbing  noted     Skin: Warm & dry           Assessment & Plan:  #1 viral URI picture See AVS

## 2013-09-16 NOTE — Patient Instructions (Signed)
Plain Mucinex (NOT D) for thick secretions ;force NON dairy fluids .   Nasal cleansing in the shower as discussed with lather of mild shampoo.After 10 seconds wash off lather while  exhaling through nostrils. Make sure that all residual soap is removed to prevent irritation.  Flonase OR Nasacort AQ 1 spray in each nostril twice a day as needed. Use the "crossover" technique into opposite nostril spraying toward opposite ear @ 45 degree angle, not straight up into nostril.  Use a Neti pot daily only  as needed for significant sinus congestion; going from open side to congested side . Plain Allegra (NOT D )  160 daily , Loratidine 10 mg , OR Zyrtec 10 mg @ bedtime  as needed for itchy eyes & sneezing. Zicam Melts or Zinc lozenges as per package label for sore throat . Complementary options include  vitamin C 2000 mg daily; & Echinacea for 4-7 days. Report persistent or progressive fever; discolored nasal or chest secretions; or frontal headache or facial  pain.       

## 2013-09-16 NOTE — Progress Notes (Signed)
Pre visit review using our clinic review tool, if applicable. No additional management support is needed unless otherwise documented below in the visit note. 

## 2013-12-06 ENCOUNTER — Other Ambulatory Visit: Payer: Self-pay | Admitting: Obstetrics and Gynecology

## 2013-12-06 LAB — HM PAP SMEAR

## 2013-12-08 LAB — CYTOLOGY - PAP

## 2013-12-12 ENCOUNTER — Telehealth: Payer: Self-pay | Admitting: Internal Medicine

## 2013-12-12 NOTE — Telephone Encounter (Signed)
[facility] forward 4 pages to Dr.Jones

## 2014-03-20 ENCOUNTER — Encounter: Payer: Self-pay | Admitting: Nurse Practitioner

## 2014-03-20 ENCOUNTER — Ambulatory Visit (INDEPENDENT_AMBULATORY_CARE_PROVIDER_SITE_OTHER): Payer: BC Managed Care – PPO | Admitting: Nurse Practitioner

## 2014-03-20 VITALS — BP 120/74 | HR 91 | Temp 98.9°F | Ht 63.5 in | Wt 105.0 lb

## 2014-03-20 DIAGNOSIS — R300 Dysuria: Secondary | ICD-10-CM

## 2014-03-20 DIAGNOSIS — L298 Other pruritus: Secondary | ICD-10-CM

## 2014-03-20 DIAGNOSIS — N898 Other specified noninflammatory disorders of vagina: Secondary | ICD-10-CM

## 2014-03-20 LAB — URINALYSIS, MICROSCOPIC ONLY

## 2014-03-20 LAB — POCT URINALYSIS DIPSTICK
BILIRUBIN UA: NEGATIVE
Glucose, UA: NEGATIVE
Ketones, UA: 0.5
NITRITE UA: NEGATIVE
PH UA: 6
PROTEIN UA: 15
RBC UA: POSITIVE
Spec Grav, UA: 1.025
UROBILINOGEN UA: 1

## 2014-03-20 LAB — WET PREP, GENITAL
Bacteria: NONE SEEN — AB
CLUE CELLS WET PREP: NONE SEEN — AB
Trich, Wet Prep: NONE SEEN — AB
WBC WET PREP: NONE SEEN — AB
Yeast Wet Prep HPF POC: NONE SEEN — AB

## 2014-03-20 NOTE — Progress Notes (Signed)
Pre visit review using our clinic review tool, if applicable. No additional management support is needed unless otherwise documented below in the visit note. 

## 2014-03-20 NOTE — Patient Instructions (Signed)
My office will call with lab results and treatment plan.  Use ibuprophen if needed for flank pain.

## 2014-03-20 NOTE — Progress Notes (Signed)
   Subjective:    Patient ID: Dorothy ShadeCynthia S Pangallo, female    DOB: [birth year], 49 y.o.   MRN: [id number]  Vaginal Itching The patient's primary symptoms include genital itching and pelvic pain (suprapubic pain). This is a new problem. The current episode started in the past 7 days. The problem has been gradually improving. The patient is experiencing no pain. She is not pregnant. Associated symptoms include back pain (dull R low back pain), discolored urine (red this am), dysuria, flank pain and hematuria. Pertinent negatives include no abdominal pain, chills, constipation, diarrhea, fever, frequency, headaches, nausea, rash or sore throat. Nothing aggravates the symptoms. Treatments tried: vagisil. The treatment provided moderate relief. She is sexually active. No, her partner does not have an STD. Her menstrual history has been regular. (Kidney stones)      Review of Systems  Constitutional: Negative for fever and chills.  HENT: Negative for sore throat.   Gastrointestinal: Negative for nausea, abdominal pain, diarrhea and constipation.  Genitourinary: Positive for dysuria, hematuria, flank pain and pelvic pain (suprapubic pain). Negative for frequency.  Musculoskeletal: Positive for back pain (dull R low back pain).  Skin: Negative for rash.  Neurological: Negative for headaches.       Objective:   Physical Exam  Constitutional: She is oriented to person, place, and time. She appears well-developed and well-nourished. No distress.  HENT:  Head: Normocephalic and atraumatic.  Eyes: Conjunctivae are normal. Right eye exhibits no discharge. Left eye exhibits no discharge.  Wearing glasses  Cardiovascular: Normal rate.   No murmur heard. Pulmonary/Chest: Effort normal.  Abdominal: Soft. She exhibits no distension and no mass. There is no tenderness. There is no rebound and no guarding.  Genitourinary: Vaginal discharge (nml amt, white/beige) found.  Musculoskeletal: She exhibits no  tenderness (no CVA tenderness).  Neurological: She is alert and oriented to person, place, and time.  Skin: Skin is warm and dry.  Psychiatric: She has a normal mood and affect. Her behavior is normal. Thought content normal.  Vitals reviewed.         Assessment & Plan:  1. Dysuria Low back pain, Hx stone 2 yrs ago & 4 yrs ago.  - Urine culture - Urine Microscopic - POCT urinalysis dipstick-POs blood & white cells, SG 1.025  2. Vagina itching Improved w/vagisil - Wet prep, genital F/u PRN

## 2014-03-21 ENCOUNTER — Telehealth: Payer: Self-pay | Admitting: Nurse Practitioner

## 2014-03-21 NOTE — Telephone Encounter (Signed)
Patient notified of results.

## 2014-03-21 NOTE — Telephone Encounter (Signed)
pls call pt: Advise She tested NEg for vaginal infection.  She does have a lot of blood in urine-either infection or kidney stone. Still waiting on urine culture.

## 2014-03-22 ENCOUNTER — Telehealth: Payer: Self-pay | Admitting: Nurse Practitioner

## 2014-03-22 DIAGNOSIS — N3001 Acute cystitis with hematuria: Secondary | ICD-10-CM

## 2014-03-22 LAB — URINE CULTURE: Colony Count: >=100000

## 2014-03-22 MED ORDER — NITROFURANTOIN MONOHYD MACRO 100 MG PO CAPS
100.0000 mg | ORAL_CAPSULE | Freq: Two times a day (BID) | ORAL | Status: DC
Start: 2014-03-22 — End: 2014-04-24

## 2014-03-22 MED ORDER — PHENAZOPYRIDINE HCL 200 MG PO TABS: 200.0000 mg | ORAL_TABLET | Freq: Three times a day (TID) | ORAL | Status: DC | PRN

## 2014-03-22 NOTE — Telephone Encounter (Signed)
She has UTI, please start abx today I also prescribed pyridium for comfort-it will relax bladder. It will turn urine, tears & sweat orange while taking. pls sched f/u in 4 weeks to recheck urine

## 2014-03-22 NOTE — Telephone Encounter (Signed)
LMOVM for pt to return call 

## 2014-03-24 NOTE — Telephone Encounter (Signed)
Patient called back, I informed her of her lab results. Follow up appointment made.

## 2014-03-24 NOTE — Telephone Encounter (Signed)
Patient called and LM for us to call back regarding lab results. Called patient back, no answer again.

## 2014-04-24 ENCOUNTER — Other Ambulatory Visit (INDEPENDENT_AMBULATORY_CARE_PROVIDER_SITE_OTHER): Payer: BC Managed Care – PPO

## 2014-04-24 ENCOUNTER — Encounter: Payer: Self-pay | Admitting: Internal Medicine

## 2014-04-24 ENCOUNTER — Ambulatory Visit: Payer: BC Managed Care – PPO | Admitting: Nurse Practitioner

## 2014-04-24 ENCOUNTER — Ambulatory Visit (INDEPENDENT_AMBULATORY_CARE_PROVIDER_SITE_OTHER): Payer: BC Managed Care – PPO | Admitting: Internal Medicine

## 2014-04-24 VITALS — BP 114/78 | HR 70 | Temp 98.5°F | Resp 16 | Ht 63.5 in | Wt 103.0 lb

## 2014-04-24 DIAGNOSIS — N3000 Acute cystitis without hematuria: Secondary | ICD-10-CM | POA: Diagnosis not present

## 2014-04-24 DIAGNOSIS — N390 Urinary tract infection, site not specified: Secondary | ICD-10-CM | POA: Insufficient documentation

## 2014-04-24 LAB — URINALYSIS, ROUTINE W REFLEX MICROSCOPIC
Bilirubin Urine: NEGATIVE
KETONES UR: NEGATIVE
Leukocytes, UA: NEGATIVE
Nitrite: NEGATIVE
PH: 6.5 (ref 5.0–8.0)
SPECIFIC GRAVITY, URINE: 1.02 (ref 1.000–1.030)
Total Protein, Urine: NEGATIVE
Urine Glucose: NEGATIVE
Urobilinogen, UA: 1 (ref 0.0–1.0)

## 2014-04-24 MED ORDER — CIPROFLOXACIN HCL 250 MG PO TABS
250.0000 mg | ORAL_TABLET | Freq: Two times a day (BID) | ORAL | Status: AC
Start: 2014-04-24 — End: 2014-05-01

## 2014-04-24 NOTE — Progress Notes (Signed)
   Subjective:    Patient ID: [name], female    DOB: [birth year], 49 y.o.   MRN: [id number]  HPI Comments: She was seen one month ago for a UTI, urine clx was positive for E coli and wet prep was normal. The symptoms got better on macrobid but then they returned over the last week,  Dysuria  This is a recurrent problem. The current episode started 1 to 4 weeks ago. The problem has been unchanged. The quality of the pain is described as burning. The pain is at a severity of 1/10. The patient is experiencing no pain. There has been no fever. The fever has been present for less than 1 day. She is sexually active. There is no history of pyelonephritis. Associated symptoms include frequency and urgency. Pertinent negatives include no chills, discharge, flank pain, hematuria, hesitancy, nausea, possible pregnancy, sweats or vomiting. She has tried antibiotics for the symptoms. The treatment provided mild relief. There is no history of catheterization, kidney stones, recurrent UTIs, a single kidney, urinary stasis or a urological procedure.      Review of Systems  Constitutional: Negative.  Negative for fever, chills, diaphoresis and fatigue.  HENT: Negative.   Eyes: Negative.   Respiratory: Negative.   Cardiovascular: Negative.   Gastrointestinal: Negative.  Negative for nausea, vomiting and abdominal pain.  Endocrine: Negative.   Genitourinary: Positive for dysuria, urgency and frequency. Negative for hesitancy, hematuria, flank pain, decreased urine volume, vaginal bleeding, vaginal discharge, enuresis, difficulty urinating, vaginal pain, pelvic pain and dyspareunia.  Musculoskeletal: Negative.   Skin: Negative.   Allergic/Immunologic: Negative.   Neurological: Negative.   Hematological: Negative.  Negative for adenopathy. Does not bruise/bleed easily.  Psychiatric/Behavioral: Negative.        Objective:   Physical Exam  Constitutional: She is oriented to person, place, and time.  She appears well-developed and well-nourished.  Non-toxic appearance. She does not have a sickly appearance. She does not appear ill. No distress.  HENT:  Head: Normocephalic and atraumatic.  Mouth/Throat: Oropharynx is clear and moist. No oropharyngeal exudate.  Eyes: Conjunctivae are normal. Right eye exhibits no discharge. Left eye exhibits no discharge. No scleral icterus.  Neck: Normal range of motion. Neck supple. No JVD present. No tracheal deviation present. No thyromegaly present.  Cardiovascular: Normal rate, regular rhythm, normal heart sounds and intact distal pulses.  Exam reveals no gallop and no friction rub.   No murmur heard. Pulmonary/Chest: Effort normal and breath sounds normal. No stridor. No respiratory distress. She has no wheezes. She has no rales. She exhibits no tenderness.  Abdominal: Soft. Bowel sounds are normal. She exhibits no distension and no mass. There is no hepatosplenomegaly. There is no tenderness. There is no rebound, no guarding and no CVA tenderness.  Musculoskeletal: Normal range of motion. She exhibits no edema or tenderness.  Lymphadenopathy:    She has no cervical adenopathy.  Neurological: She is oriented to person, place, and time.  Skin: Skin is warm and dry. No rash noted. She is not diaphoretic. No erythema. No pallor.  Vitals reviewed.         Assessment & Plan:

## 2014-04-24 NOTE — Patient Instructions (Signed)

## 2014-04-24 NOTE — Assessment & Plan Note (Signed)
I will recheck the UA and urine clx Will treat with cipro this time

## 2014-04-24 NOTE — Progress Notes (Signed)
Pre visit review using our clinic review tool, if applicable. No additional management support is needed unless otherwise documented below in the visit note. 

## 2014-04-26 LAB — CULTURE, URINE COMPREHENSIVE: Colony Count: 70000

## 2014-11-15 ENCOUNTER — Other Ambulatory Visit (INDEPENDENT_AMBULATORY_CARE_PROVIDER_SITE_OTHER): Payer: BC Managed Care – PPO

## 2014-11-15 ENCOUNTER — Ambulatory Visit (INDEPENDENT_AMBULATORY_CARE_PROVIDER_SITE_OTHER): Payer: BC Managed Care – PPO | Admitting: Internal Medicine

## 2014-11-15 ENCOUNTER — Encounter: Payer: Self-pay | Admitting: Internal Medicine

## 2014-11-15 VITALS — BP 102/78 | HR 76 | Temp 98.0°F | Resp 16 | Ht 63.0 in | Wt 101.0 lb

## 2014-11-15 DIAGNOSIS — Z23 Encounter for immunization: Secondary | ICD-10-CM

## 2014-11-15 DIAGNOSIS — R310 Gross hematuria: Secondary | ICD-10-CM

## 2014-11-15 DIAGNOSIS — R300 Dysuria: Secondary | ICD-10-CM | POA: Diagnosis not present

## 2014-11-15 DIAGNOSIS — R1012 Left upper quadrant pain: Secondary | ICD-10-CM

## 2014-11-15 DIAGNOSIS — R109 Unspecified abdominal pain: Secondary | ICD-10-CM

## 2014-11-15 LAB — CBC WITH DIFFERENTIAL/PLATELET
BASOS PCT: 0.5 % (ref 0.0–3.0)
Basophils Absolute: 0 10*3/uL (ref 0.0–0.1)
EOS ABS: 0.2 10*3/uL (ref 0.0–0.7)
Eosinophils Relative: 3.9 % (ref 0.0–5.0)
HCT: 38.6 % (ref 36.0–46.0)
Hemoglobin: 12.6 g/dL (ref 12.0–15.0)
Lymphocytes Relative: 25.9 % (ref 12.0–46.0)
Lymphs Abs: 1.2 10*3/uL (ref 0.7–4.0)
MCHC: 32.7 g/dL (ref 30.0–36.0)
MCV: 87 fl (ref 78.0–100.0)
MONO ABS: 0.3 10*3/uL (ref 0.1–1.0)
Monocytes Relative: 5.7 % (ref 3.0–12.0)
NEUTROS ABS: 2.9 10*3/uL (ref 1.4–7.7)
NEUTROS PCT: 64 % (ref 43.0–77.0)
PLATELETS: 223 10*3/uL (ref 150.0–400.0)
RBC: 4.44 Mil/uL (ref 3.87–5.11)
RDW: 12.7 % (ref 11.5–15.5)
WBC: 4.6 10*3/uL (ref 4.0–10.5)

## 2014-11-15 LAB — BASIC METABOLIC PANEL
BUN: 14 mg/dL (ref 6–23)
CALCIUM: 9.5 mg/dL (ref 8.4–10.5)
CHLORIDE: 103 meq/L (ref 96–112)
CO2: 30 meq/L (ref 19–32)
CREATININE: 0.77 mg/dL (ref 0.40–1.20)
GFR: 84.92 mL/min (ref >60.00)
Glucose, Bld: 133 mg/dL — ABNORMAL HIGH (ref 70–99)
Potassium: 4.1 mEq/L (ref 3.5–5.1)
Sodium: 140 mEq/L (ref 135–145)

## 2014-11-15 LAB — POCT URINALYSIS DIPSTICK
BILIRUBIN UA: NEGATIVE
Glucose, UA: NEGATIVE
NITRITE UA: NEGATIVE
Protein, UA: 1
Spec Grav, UA: 1.025
Urobilinogen, UA: 0.2
pH, UA: 6

## 2014-11-15 LAB — URINALYSIS, ROUTINE W REFLEX MICROSCOPIC
Bilirubin Urine: NEGATIVE
LEUKOCYTES UA: NEGATIVE
Nitrite: NEGATIVE
SPECIFIC GRAVITY, URINE: 1.02 (ref 1.000–1.030)
TOTAL PROTEIN, URINE-UPE24: 30 — AB
URINE GLUCOSE: NEGATIVE
UROBILINOGEN UA: 0.2 (ref 0.0–1.0)
pH: 6 (ref 5.0–8.0)

## 2014-11-15 LAB — SEDIMENTATION RATE: Sed Rate: 10 mm/hr (ref 0–22)

## 2014-11-15 MED ORDER — OXYCODONE-ACETAMINOPHEN 7.5-325 MG PO TABS: 1.0000 | ORAL_TABLET | ORAL | Status: DC | PRN

## 2014-11-15 MED ORDER — PROMETHAZINE HCL 12.5 MG PO TABS: 12.5000 mg | ORAL_TABLET | Freq: Four times a day (QID) | ORAL | Status: DC | PRN

## 2014-11-15 NOTE — Patient Instructions (Signed)

## 2014-11-15 NOTE — Progress Notes (Signed)
Pre visit review using our clinic review tool, if applicable. No additional management support is needed unless otherwise documented below in the visit note. 

## 2014-11-15 NOTE — Progress Notes (Signed)
Subjective:  Patient ID: [name], female    DOB: Aug 16, 1965  Age: 49 y.o. MRN: [id number]  CC: Flank Pain   HPI [name] presents for a 4 day hx of left flank pain that radiates into her left groin with hematuria. She had a kidney stone about 5 years ago that was treated in [place].  No outpatient prescriptions prior to visit.   No facility-administered medications prior to visit.    ROS Review of Systems  Constitutional: Negative.  Negative for fever, chills, diaphoresis, appetite change and fatigue.  HENT: Negative.   Eyes: Negative.   Respiratory: Negative.  Negative for cough, choking, shortness of breath and stridor.   Cardiovascular: Negative.  Negative for chest pain, palpitations and leg swelling.  Gastrointestinal: Negative.  Negative for nausea, vomiting, abdominal pain, diarrhea, constipation and blood in stool.  Endocrine: Negative.   Genitourinary: Positive for hematuria, flank pain and pelvic pain. Negative for dysuria, urgency, frequency and difficulty urinating.  Musculoskeletal: Negative.  Negative for myalgias, back pain and arthralgias.  Skin: Negative.  Negative for color change, pallor, rash and wound.  Allergic/Immunologic: Negative.   Neurological: Negative.  Negative for dizziness, light-headedness, numbness and headaches.  Hematological: Negative.  Negative for adenopathy. Does not bruise/bleed easily.  Psychiatric/Behavioral: Negative.     Objective:  BP 102/78 mmHg  Pulse 76  Temp(Src) 98 F (36.7 C) (Oral)  Resp 16  Ht  (1.6 m)  Wt 101 lb (45.813 kg)  BMI 17.90 kg/m2  SpO2 97%  LMP 08/30/2014  BP Readings from Last 3 Encounters:  11/15/14 102/78  04/24/14 114/78  03/20/14 120/74    Wt Readings from Last 3 Encounters:  11/15/14 101 lb (45.813 kg)  04/24/14 103 lb (46.72 kg)  03/20/14 105 lb (47.628 kg)    Physical Exam  Constitutional: She is oriented to person, place, and time. She appears well-developed and  well-nourished.  Non-toxic appearance. She does not have a sickly appearance. She does not appear ill. No distress.  HENT:  Head: Normocephalic and atraumatic.  Mouth/Throat: Oropharynx is clear and moist. No oropharyngeal exudate.  Eyes: Conjunctivae are normal. Right eye exhibits no discharge. Left eye exhibits no discharge. No scleral icterus.  Neck: Normal range of motion. Neck supple. No JVD present. No tracheal deviation present. No thyromegaly present.  Cardiovascular: Normal rate, regular rhythm, normal heart sounds and intact distal pulses.  Exam reveals no gallop and no friction rub.   No murmur heard. Pulmonary/Chest: Effort normal and breath sounds normal. No stridor. No respiratory distress. She has no wheezes. She has no rales. She exhibits no tenderness.  Abdominal: Soft. Normal appearance and bowel sounds are normal. She exhibits no distension and no mass. There is no hepatosplenomegaly, splenomegaly or hepatomegaly. There is no tenderness. There is CVA tenderness (left). There is no rebound and no guarding.  Musculoskeletal: Normal range of motion. She exhibits no edema or tenderness.  Lymphadenopathy:    She has no cervical adenopathy.  Neurological: She is oriented to person, place, and time.  Skin: Skin is warm and dry. No rash noted. She is not diaphoretic. No erythema. No pallor.  Vitals reviewed.   Lab Results  Component Value Date   WBC 5.0 12/02/2012   HGB 12.6 12/02/2012   HCT 37.5 12/02/2012   PLT 243.0 12/02/2012   GLUCOSE 99 12/02/2012   CHOL 172 12/02/2012   TRIG 79.0 12/02/2012   HDL 73.30 12/02/2012   LDLCALC 83 12/02/2012   ALT 10  12/02/2012   AST 15 12/02/2012   NA 135 12/02/2012   K 3.4* 12/02/2012   CL 101 12/02/2012   CREATININE 0.6 12/02/2012   BUN 13 12/02/2012   CO2 27 12/02/2012   TSH 0.79 12/02/2012    [place] Breast Left  12/06/2012  CLINICAL DATA:  Diffuse right breast pain. Recent normal screening mammogram 10/26/2012. EXAM: ULTRASOUND  LEFT BREAST COMPARISON:  With priors FINDINGS: On physical exam,I do not palpate a mass in the right breast. Ultrasound is performed, showing normal tissue throughout the entire right breast. No solid or cystic mass, abnormal shadowing or distortion detected. IMPRESSION: No sonographic evidence of malignancy in the right breast. RECOMMENDATION: Bilateral screening mammogram in October of 2015 is recommended. I have discussed the findings and recommendations with the patient. Results were also provided in writing at the conclusion of the visit. If applicable, a reminder letter will be sent to the patient regarding the next appointment. BI-RADS CATEGORY  1: Negative Electronically Signed   By: Baird Lyonsina  Arceo M.D.   On: 12/06/2012 15:43    Assessment & Plan:   Aram BeechamCynthia was seen today for flank pain.  Diagnoses and all orders for this visit:  Dysuria- her urinalysis does not indicate that there is a urinary tract infection, I will check a urine culture to be certain. -     POCT urinalysis dipstick -     CULTURE, URINE COMPREHENSIVE; Future  Need for Tdap vaccination -     Tdap vaccine greater than or equal to 7yo IM  Acute left flank pain- she has signs and symptoms consistent with acute left nephrolithiasis. She will take Percocet and Phenergan for pain and symptom relief. I will check her white blood cell count to screen for infection and will monitor her renal function. I've ordered a stat CT renal stone study protocol to see if she has a stone. If it is a large, obstructing, stone causing complications and I will refer her urgently to urology. If it is a small stone with a high likelihood that it will pass and I may add on an awful blocker. -     Urinalysis, Routine w reflex microscopic ([facility]); Future -     Basic metabolic panel; Future -     CBC with Differential/Platelet; Future -     oxyCODONE-acetaminophen (PERCOCET) 7.5-325 MG tablet; Take 1 tablet by mouth every 4 (four) hours as  needed. -     promethazine (PHENERGAN) 12.5 MG tablet; Take 1 tablet (12.5 mg total) by mouth every 6 (six) hours as needed for nausea or vomiting. -     CT RENAL STONE STUDY; Future  Hematuria, gross- as above -     CULTURE, URINE COMPREHENSIVE; Future -     Urinalysis, Routine w reflex microscopic ([facility]); Future -     Sedimentation rate; Future -     CT RENAL STONE STUDY; Future   I am having Ms. Hansley start on oxyCODONE-acetaminophen and promethazine. I am also having her maintain her fluticasone.  Meds ordered this encounter  Medications  . fluticasone (FLONASE) 50 MCG/ACT nasal spray    Sig: Place 1 spray into both nostrils daily.  [name] Kitchen. oxyCODONE-acetaminophen (PERCOCET) 7.5-325 MG tablet    Sig: Take 1 tablet by mouth every 4 (four) hours as needed.    Dispense:  25 tablet    Refill:  0  . promethazine (PHENERGAN) 12.5 MG tablet    Sig: Take 1 tablet (12.5 mg total) by mouth  every 6 (six) hours as needed for nausea or vomiting.    Dispense:  30 tablet    Refill:  0     Follow-up: Return in about 3 weeks (around 12/06/2014).  Sanda Linger, MD

## 2014-11-16 ENCOUNTER — Ambulatory Visit: Payer: BC Managed Care – PPO | Admitting: Internal Medicine

## 2014-11-16 LAB — CULTURE, URINE COMPREHENSIVE
Colony Count: NO GROWTH
ORGANISM ID, BACTERIA: NO GROWTH

## 2014-11-20 ENCOUNTER — Other Ambulatory Visit: Payer: Self-pay | Admitting: Internal Medicine

## 2014-11-20 DIAGNOSIS — R109 Unspecified abdominal pain: Secondary | ICD-10-CM

## 2014-11-20 DIAGNOSIS — N390 Urinary tract infection, site not specified: Secondary | ICD-10-CM

## 2014-11-22 ENCOUNTER — Other Ambulatory Visit: Payer: Self-pay | Admitting: Internal Medicine

## 2014-11-22 ENCOUNTER — Ambulatory Visit (INDEPENDENT_AMBULATORY_CARE_PROVIDER_SITE_OTHER)
Admission: RE | Admit: 2014-11-22 | Discharge: 2014-11-22 | Disposition: A | Discharge status: Home / Self Care | Payer: BC Managed Care – PPO | Source: Ambulatory Visit | Attending: Internal Medicine | Admitting: Internal Medicine

## 2014-11-22 DIAGNOSIS — R109 Unspecified abdominal pain: Secondary | ICD-10-CM

## 2014-11-22 DIAGNOSIS — N390 Urinary tract infection, site not specified: Secondary | ICD-10-CM

## 2014-11-22 DIAGNOSIS — R1012 Left upper quadrant pain: Secondary | ICD-10-CM | POA: Diagnosis not present

## 2014-11-22 DIAGNOSIS — N200 Calculus of kidney: Secondary | ICD-10-CM

## 2014-11-23 ENCOUNTER — Encounter: Payer: Self-pay | Admitting: Internal Medicine

## 2014-11-23 ENCOUNTER — Telehealth: Payer: Self-pay | Admitting: Internal Medicine

## 2014-11-23 NOTE — Telephone Encounter (Signed)
Pt called and I informed her of the results from her scan and the referral that was put in.  She wanted to let you know that the pain is now on the other side.

## 2014-11-27 NOTE — Telephone Encounter (Signed)
That makes sense, she appears to have stones on both sides

## 2014-12-06 ENCOUNTER — Ambulatory Visit: Payer: BC Managed Care – PPO | Admitting: Internal Medicine

## 2014-12-12 LAB — HM MAMMOGRAPHY: HM Mammogram: NORMAL

## 2014-12-13 ENCOUNTER — Encounter: Payer: Self-pay | Admitting: Internal Medicine

## 2015-01-11 ENCOUNTER — Telehealth: Payer: Self-pay | Admitting: Internal Medicine

## 2015-01-11 NOTE — Telephone Encounter (Signed)
Is this in relation to labs done by the urologist? If so, I do not have the labs to discuss with her in any meaningful way

## 2015-01-11 NOTE — Telephone Encounter (Signed)
Patient is calling to request to speak with you. She states that she saw her urologist, and has some questions on some lab results. i was unable to get any other information.

## 2015-01-16 ENCOUNTER — Ambulatory Visit (INDEPENDENT_AMBULATORY_CARE_PROVIDER_SITE_OTHER): Payer: BC Managed Care – PPO | Admitting: Internal Medicine

## 2015-01-16 ENCOUNTER — Encounter: Payer: Self-pay | Admitting: Internal Medicine

## 2015-01-16 VITALS — BP 128/82 | HR 83 | Temp 97.3°F | Resp 16 | Ht 63.0 in | Wt 103.0 lb

## 2015-01-16 DIAGNOSIS — Z23 Encounter for immunization: Secondary | ICD-10-CM

## 2015-01-16 DIAGNOSIS — R011 Cardiac murmur, unspecified: Secondary | ICD-10-CM

## 2015-01-16 DIAGNOSIS — R0609 Other forms of dyspnea: Secondary | ICD-10-CM | POA: Diagnosis not present

## 2015-01-16 NOTE — Progress Notes (Signed)
Subjective:  Patient ID: [name], female    DOB: 04-Mar-1965  Age: 49 y.o. MRN: [id number]  CC: Heart Murmur   HPI ABUK SELLECK presents for a f/up on recent CXR that raises concern for COPD and an enlarged heart - she tells me that she has had a murmur from a "leaky valve" since childhood. She has no s/s of COPD and has no risk factors for lung disease. She reports an occasional episode of bilateral neck pain but no s/s of heart disease.  Outpatient Prescriptions Prior to Visit  Medication Sig Dispense Refill  . fluticasone (FLONASE) 50 MCG/ACT nasal spray Place 1 spray into both nostrils daily.    [name] Kitchen oxyCODONE-acetaminophen (PERCOCET) 7.5-325 MG tablet Take 1 tablet by mouth every 4 (four) hours as needed. (Patient not taking: Reported on 01/16/2015) 25 tablet 0  . promethazine (PHENERGAN) 12.5 MG tablet Take 1 tablet (12.5 mg total) by mouth every 6 (six) hours as needed for nausea or vomiting. 30 tablet 0   No facility-administered medications prior to visit.    ROS Review of Systems  Constitutional: Negative.  Negative for fever, chills, diaphoresis, activity change, appetite change, fatigue and unexpected weight change.  HENT: Negative.  Negative for sore throat and trouble swallowing.   Eyes: Negative.   Respiratory: Negative.  Negative for cough, choking, chest tightness, shortness of breath and stridor.   Cardiovascular: Negative.  Negative for chest pain, palpitations and leg swelling.  Gastrointestinal: Negative.  Negative for nausea, vomiting, abdominal pain, diarrhea, constipation and blood in stool.  Endocrine: Negative.   Genitourinary: Negative.   Musculoskeletal: Positive for neck pain. Negative for back pain, arthralgias and neck stiffness.  Skin: Negative.  Negative for color change and rash.  Allergic/Immunologic: Negative.   Neurological: Negative.  Negative for dizziness, syncope, weakness and light-headedness.  Hematological: Negative.  Negative for  adenopathy. Does not bruise/bleed easily.  Psychiatric/Behavioral: Negative.     Objective:  BP 128/82 mmHg  Pulse 83  Temp(Src) 97.3 F (36.3 C) (Oral)  Ht  (1.6 m)  Wt 103 lb (46.72 kg)  BMI 18.25 kg/m2  SpO2 99%  BP Readings from Last 3 Encounters:  01/16/15 128/82  11/15/14 102/78  04/24/14 114/78    Wt Readings from Last 3 Encounters:  01/16/15 103 lb (46.72 kg)  11/15/14 101 lb (45.813 kg)  04/24/14 103 lb (46.72 kg)    Physical Exam  Constitutional: She is oriented to person, place, and time. No distress.  HENT:  Mouth/Throat: Oropharynx is clear and moist. No oropharyngeal exudate.  Eyes: Conjunctivae are normal. Right eye exhibits no discharge. Left eye exhibits no discharge. No scleral icterus.  Neck: Normal range of motion. Neck supple. No JVD present. No tracheal deviation present. No thyromegaly present.  Cardiovascular: Normal rate, regular rhythm, S1 normal, S2 normal and intact distal pulses.  Exam reveals no gallop and no friction rub.   Murmur heard.  Systolic murmur is present with a grade of 1/6   No diastolic murmur is present  Pulses:      Carotid pulses are 1+ on the right side, and 1+ on the left side.      Radial pulses are 1+ on the right side, and 1+ on the left side.       Femoral pulses are 1+ on the right side, and 1+ on the left side.      Popliteal pulses are 1+ on the right side, and 1+ on the left side.  Dorsalis pedis pulses are 1+ on the right side, and 1+ on the left side.       Posterior tibial pulses are 1+ on the right side, and 1+ on the left side.  EKG-  Sinus  Rhythm  -RSR(V1) -nondiagnostic.   -Left axis.   -Nonspecific ST depression  -Nondiagnostic.   Pulmonary/Chest: Effort normal and breath sounds normal. No stridor. No respiratory distress. She has no wheezes. She has no rales. She exhibits no tenderness.  Abdominal: Soft. Bowel sounds are normal. She exhibits no distension and no mass. There is no tenderness.  There is no rebound and no guarding.  Musculoskeletal: Normal range of motion. She exhibits no edema or tenderness.  Lymphadenopathy:    She has no cervical adenopathy.  Neurological: She is oriented to person, place, and time.  Skin: Skin is warm and dry. No rash noted. She is not diaphoretic. No erythema. No pallor.  Vitals reviewed.   Lab Results  Component Value Date   WBC 4.6 11/15/2014   HGB 12.6 11/15/2014   HCT 38.6 11/15/2014   PLT 223.0 11/15/2014   GLUCOSE 133* 11/15/2014   CHOL 172 12/02/2012   TRIG 79.0 12/02/2012   HDL 73.30 12/02/2012   LDLCALC 83 12/02/2012   ALT 10 12/02/2012   AST 15 12/02/2012   NA 140 11/15/2014   K 4.1 11/15/2014   CL 103 11/15/2014   CREATININE 0.77 11/15/2014   BUN 14 11/15/2014   CO2 30 11/15/2014   TSH 0.79 12/02/2012    Dg Abd Acute W/chest  11/22/2014  CLINICAL DATA:  Abdominal pain. EXAM: DG ABDOMEN ACUTE W/ 1V CHEST COMPARISON:  CT 08/14/2009. FINDINGS: Hyperexpansion of both lung fields noted consistent with COPD. Cardiomegaly. Soft tissues the abdomen are unremarkable. Bilateral nephrolithiasis again noted. Calcifications in the pelvis. These may represent phleboliths. Distal urolithiasis cannot be excluded. No bowel distention. No free air. Degenerative changes lumbar spine and both hips. Diffuse osteopenia . IMPRESSION: 1. COPD. 2. Bilateral nephrolithiasis. Multiple calcifications in the pelvis consistent phleboliths. Distal urolithiasis cannot be excluded . Electronically Signed   By: Maisie Fushomas  Register   On: 11/22/2014 09:11    Assessment & Plan:   Aram BeechamCynthia was seen today for heart murmur.  Diagnoses and all orders for this visit:  Systolic murmur- I think the CXR finding of her lungs is her body habitus and that she does not have lung disease, will get an ECHO to what the cause of the murmur and abnormal EKG are, most likely MR that is asx -     ECHOCARDIOGRAM COMPLETE; Future  Encounter for immunization  DOE (dyspnea on  exertion)- she does not c/o DOE, this was entered by the CMA to attach to the EKG charge -     EKG 12-Lead  Other orders -     Flu Vaccine QUAD 36+ mos IM   I have discontinued Ms. Babineaux's oxyCODONE-acetaminophen and promethazine. I am also having her maintain her fluticasone.  No orders of the defined types were placed in this encounter.     Follow-up: Return in about 2 months (around 03/19/2015).  Sanda Lingerhomas Brettney Ficken, MD

## 2015-01-16 NOTE — Patient Instructions (Signed)
Heart Murmur A heart murmur is an extra sound heard by your health care provider when listening to your heart with a device called a stethoscope. The sound comes from turbulence when blood flows through the heart and may be a "hum" or "whoosh" sound heard when the heart beats. There are two types of heart murmurs:  Innocent murmurs. Most people with this type of heart murmur do not have a heart problem. Many children have innocent heart murmurs. Your health care provider may suggest some basic testing to know whether your murmur is an innocent murmur. If an innocent heart murmur is found, there is no need for further tests or treatment and no need to restrict activities or stop playing sports.  Abnormal murmurs. These types of murmurs can occur in children and adults. In children, abnormal heart murmurs are typically caused from heart defects that are present at birth (congenital). In adults, abnormal murmurs are usually from heart valve problems caused by disease, infection, or aging. CAUSES  Normally, these valves open to let blood flow through or out of your heart and then shut to keep it from flowing backward. If they do not work properly, you could have:  Regurgitation--When blood leaks back through the valve in the wrong direction.  Mitral valve prolapse--When the mitral valve of the heart has a loose flap and does not close tightly.  Stenosis--When the valve does not open enough and blocks blood flow. SIGNS AND SYMPTOMS  Innocent murmurs do not cause symptoms, and many people with abnormal murmurs may or may not have symptoms. If symptoms do develop, they may include:  Shortness of breath.  Blue coloring of the skin, especially on the fingertips.  Chest pain.  Palpitations, or feeling a fluttering or skipped heartbeat.  Fainting.  Persistent cough.  Getting tired much faster than expected. DIAGNOSIS  A heart murmur might be heard during a sports physical or during any type of  examination. When a murmur is heard, it may suggest a possible problem. When this happens, your health care provider may ask you to see a heart specialist (cardiologist). You may also be asked to have one or more heart tests. In these cases, testing may vary depending on what your health care provider heard. Tests for a heart murmur may include:  Electrocardiogram.  Echocardiogram.  MRI. For children and adults who have an abnormal heart murmur and want to play sports, it is important to complete testing, review test results, and receive recommendations from your health care provider. If heart disease is present, it may not be safe to play. TREATMENT  Innocent murmurs require no treatment or activity restriction. If an abnormal murmur represents a problem with the heart, treatment will depend on the exact nature of the problem. In these cases, medicine or surgery may be needed to treat the problem. HOME CARE INSTRUCTIONS If you want to participate in sports or other types of strenuous physical activity, it is important to discuss this first with your health care provider. If the murmur represents a problem with the heart and you choose to participate in sports, there is a small chance that a serious problem (including sudden death) could result.  SEEK MEDICAL CARE IF:   You feel that your symptoms are slowly worsening.  You develop any new symptoms that cause concern.  You feel that you are having side effects from any medicines prescribed. SEEK IMMEDIATE MEDICAL CARE IF:   You develop chest pain.  You have shortness of breath.    You notice that your heart beats irregularly often enough to cause you to worry.  You have fainting spells.  Your symptoms suddenly get worse.   This information is not intended to replace advice given to you by your health care provider. Make sure you discuss any questions you have with your health care provider.   Document Released: 02/14/2004 Document  Revised: 01/27/2014 Document Reviewed: 09/13/2012 Elsevier Interactive Patient Education 2016 [employer]  

## 2015-01-16 NOTE — Progress Notes (Signed)
Pre visit review using our clinic review tool, if applicable. No additional management support is needed unless otherwise documented below in the visit note. 

## 2015-01-24 ENCOUNTER — Other Ambulatory Visit: Payer: Self-pay | Admitting: Urology

## 2015-01-24 ENCOUNTER — Encounter (HOSPITAL_COMMUNITY): Payer: Self-pay | Admitting: *Deleted

## 2015-01-25 ENCOUNTER — Encounter (HOSPITAL_COMMUNITY): Payer: Self-pay | Admitting: *Deleted

## 2015-01-29 ENCOUNTER — Encounter (HOSPITAL_COMMUNITY): Payer: Self-pay | Admitting: *Deleted

## 2015-01-29 NOTE — Progress Notes (Signed)
NPO AFTER MN.  ARRIVE AT 1030.  NEEDS HG.  MAY TAKE OXYCODONE AM DOS W/ SIPS OF WATER.

## 2015-01-30 ENCOUNTER — Encounter (HOSPITAL_COMMUNITY)
Admission: RE | Disposition: A | Discharge status: Home / Self Care | Payer: Self-pay | Source: Ambulatory Visit | Attending: Urology

## 2015-01-30 ENCOUNTER — Ambulatory Visit (HOSPITAL_COMMUNITY)
Admission: RE | Admit: 2015-01-30 | Discharge: 2015-01-30 | Disposition: A | Discharge status: Home / Self Care | Payer: BC Managed Care – PPO | Source: Ambulatory Visit | Attending: Urology | Admitting: Urology

## 2015-01-30 ENCOUNTER — Ambulatory Visit (HOSPITAL_BASED_OUTPATIENT_CLINIC_OR_DEPARTMENT_OTHER): Payer: BC Managed Care – PPO | Admitting: Anesthesiology

## 2015-01-30 ENCOUNTER — Encounter (HOSPITAL_COMMUNITY): Payer: Self-pay

## 2015-01-30 DIAGNOSIS — K219 Gastro-esophageal reflux disease without esophagitis: Secondary | ICD-10-CM | POA: Diagnosis not present

## 2015-01-30 DIAGNOSIS — Z87442 Personal history of urinary calculi: Secondary | ICD-10-CM | POA: Diagnosis not present

## 2015-01-30 DIAGNOSIS — N201 Calculus of ureter: Secondary | ICD-10-CM

## 2015-01-30 DIAGNOSIS — R3129 Other microscopic hematuria: Secondary | ICD-10-CM | POA: Diagnosis not present

## 2015-01-30 DIAGNOSIS — Z7951 Long term (current) use of inhaled steroids: Secondary | ICD-10-CM | POA: Insufficient documentation

## 2015-01-30 HISTORY — DX: Personal history of urinary calculi: Z87.442

## 2015-01-30 HISTORY — DX: Allergic rhinitis, unspecified: J30.9

## 2015-01-30 HISTORY — PX: CYSTOSCOPY WITH HOLMIUM LASER LITHOTRIPSY: SHX6639

## 2015-01-30 HISTORY — DX: Cardiac murmur, unspecified: R01.1

## 2015-01-30 HISTORY — DX: Calculus of ureter: N20.1

## 2015-01-30 HISTORY — DX: Presence of spectacles and contact lenses: Z97.3

## 2015-01-30 HISTORY — PX: STONE EXTRACTION WITH BASKET: SHX5318

## 2015-01-30 HISTORY — PX: CYSTOSCOPY WITH RETROGRADE PYELOGRAM, URETEROSCOPY AND STENT PLACEMENT: SHX5789

## 2015-01-30 LAB — POCT HEMOGLOBIN-HEMACUE: HEMOGLOBIN: 13.9 g/dL (ref 12.0–15.0)

## 2015-01-30 SURGERY — CYSTOURETEROSCOPY, WITH RETROGRADE PYELOGRAM AND STENT INSERTION
Anesthesia: General | Laterality: Bilateral

## 2015-01-30 MED ORDER — GLYCOPYRROLATE 0.2 MG/ML IJ SOLN
INTRAMUSCULAR | Status: DC | PRN
  Administered 2015-01-30: 0.2 mg via INTRAVENOUS

## 2015-01-30 MED ORDER — ACETAMINOPHEN 10 MG/ML IV SOLN
INTRAVENOUS | Status: DC | PRN
  Administered 2015-01-30: 1000 mg via INTRAVENOUS

## 2015-01-30 MED ORDER — LACTATED RINGERS IV SOLN
INTRAVENOUS | Status: DC
  Administered 2015-01-30 (×3): via INTRAVENOUS

## 2015-01-30 MED ORDER — UROGESIC-BLUE 81.6 MG PO TABS: ORAL_TABLET | ORAL | Status: DC

## 2015-01-30 MED ORDER — PROMETHAZINE HCL 25 MG/ML IJ SOLN: 6.2500 mg | INTRAMUSCULAR | Status: DC | PRN

## 2015-01-30 MED ORDER — BELLADONNA ALKALOIDS-OPIUM 16.2-60 MG RE SUPP
RECTAL | Status: DC | PRN
  Administered 2015-01-30: 1 via RECTAL

## 2015-01-30 MED ORDER — PROPOFOL 10 MG/ML IV BOLUS
INTRAVENOUS | Status: DC | PRN
  Administered 2015-01-30: 150 mg via INTRAVENOUS

## 2015-01-30 MED ORDER — IOHEXOL 350 MG/ML SOLN
INTRAVENOUS | Status: DC | PRN
  Administered 2015-01-30: 20 mL via URETHRAL

## 2015-01-30 MED ORDER — LACTATED RINGERS IV SOLN: INTRAVENOUS | Status: DC

## 2015-01-30 MED ORDER — MIDAZOLAM HCL 2 MG/2ML IJ SOLN
INTRAMUSCULAR | Status: DC | PRN
  Administered 2015-01-30 (×2): 0.5 mg via INTRAVENOUS
  Administered 2015-01-30: 1 mg via INTRAVENOUS

## 2015-01-30 MED ORDER — SODIUM CHLORIDE 0.9 % IR SOLN
Status: DC | PRN
  Administered 2015-01-30: 1000 mL via INTRAVESICAL
  Administered 2015-01-30: 4000 mL via INTRAVESICAL
  Administered 2015-01-30: 1000 mL via INTRAVESICAL
  Administered 2015-01-30: 3000 mL via INTRAVESICAL

## 2015-01-30 MED ORDER — ONDANSETRON HCL 4 MG/2ML IJ SOLN
INTRAMUSCULAR | Status: DC | PRN
  Administered 2015-01-30 (×2): 4 mg via INTRAVENOUS

## 2015-01-30 MED ORDER — SUCCINYLCHOLINE CHLORIDE 20 MG/ML IJ SOLN
INTRAMUSCULAR | Status: DC | PRN
Start: 2015-01-30 — End: 2015-01-30
  Administered 2015-01-30: 100 mg via INTRAVENOUS

## 2015-01-30 MED ORDER — LIDOCAINE HCL 2 % EX GEL
CUTANEOUS | Status: AC
  Filled 2015-01-30: qty 5

## 2015-01-30 MED ORDER — TRIMETHOPRIM 100 MG PO TABS: 100.0000 mg | ORAL_TABLET | ORAL | Status: DC

## 2015-01-30 MED ORDER — LIDOCAINE HCL (CARDIAC) 20 MG/ML IV SOLN
INTRAVENOUS | Status: DC | PRN
  Administered 2015-01-30: 50 mg via INTRAVENOUS

## 2015-01-30 MED ORDER — ACETAMINOPHEN 10 MG/ML IV SOLN
INTRAVENOUS | Status: AC
  Filled 2015-01-30: qty 100

## 2015-01-30 MED ORDER — CIPROFLOXACIN IN D5W 400 MG/200ML IV SOLN
INTRAVENOUS | Status: AC
  Filled 2015-01-30: qty 200

## 2015-01-30 MED ORDER — FENTANYL CITRATE (PF) 100 MCG/2ML IJ SOLN
INTRAMUSCULAR | Status: DC | PRN
  Administered 2015-01-30: 50 ug via INTRAVENOUS
  Administered 2015-01-30 (×2): 25 ug via INTRAVENOUS

## 2015-01-30 MED ORDER — CEFAZOLIN SODIUM-DEXTROSE 2-3 GM-% IV SOLR: 2.0000 g | INTRAVENOUS | Status: DC

## 2015-01-30 MED ORDER — ONDANSETRON HCL 4 MG/2ML IJ SOLN
INTRAMUSCULAR | Status: AC
  Filled 2015-01-30: qty 2

## 2015-01-30 MED ORDER — MEPERIDINE HCL 50 MG/ML IJ SOLN: 6.2500 mg | INTRAMUSCULAR | Status: DC | PRN

## 2015-01-30 MED ORDER — CIPROFLOXACIN IN D5W 400 MG/200ML IV SOLN
INTRAVENOUS | Status: DC | PRN
  Administered 2015-01-30: 400 mg via INTRAVENOUS

## 2015-01-30 MED ORDER — HYOSCYAMINE SULFATE 0.125 MG SL SUBL: 0.1250 mg | SUBLINGUAL_TABLET | SUBLINGUAL | Status: DC | PRN

## 2015-01-30 MED ORDER — BELLADONNA ALKALOIDS-OPIUM 16.2-60 MG RE SUPP
RECTAL | Status: AC
  Filled 2015-01-30: qty 1

## 2015-01-30 MED ORDER — FENTANYL CITRATE (PF) 100 MCG/2ML IJ SOLN: 25.0000 ug | INTRAMUSCULAR | Status: DC | PRN

## 2015-01-30 MED ORDER — ONDANSETRON HCL 4 MG/2ML IJ SOLN
4.0000 mg | Freq: Once | INTRAMUSCULAR | Status: AC
  Administered 2015-01-30: 4 mg via INTRAVENOUS

## 2015-01-30 MED ORDER — KETOROLAC TROMETHAMINE 30 MG/ML IJ SOLN
INTRAMUSCULAR | Status: DC | PRN
  Administered 2015-01-30: 30 mg via INTRAVENOUS

## 2015-01-30 MED ORDER — HYDROCODONE-ACETAMINOPHEN 5-300 MG PO TABS: 5.0000 mg | ORAL_TABLET | Freq: Four times a day (QID) | ORAL | Status: DC | PRN

## 2015-01-30 MED ORDER — DEXAMETHASONE SODIUM PHOSPHATE 4 MG/ML IJ SOLN
INTRAMUSCULAR | Status: DC | PRN
  Administered 2015-01-30: 10 mg via INTRAVENOUS

## 2015-01-30 SURGICAL SUPPLY — 44 items
ADAPTER CATH URET PLST 4-6FR (CATHETERS) IMPLANT
ADPR CATH URET STRL DISP 4-6FR (CATHETERS)
BAG DRAIN URO-CYSTO SKYTR STRL (DRAIN) ×3 IMPLANT
BAG DRN UROCATH (DRAIN) ×1
BASKET DAKOTA 1.9FR 11X120 (BASKET) ×2 IMPLANT
BASKET LASER NITINOL 1.9FR (BASKET) IMPLANT
BASKET STNLS GEMINI 4WIRE 3FR (BASKET) IMPLANT
BASKET ZERO TIP NITINOL 2.4FR (BASKET) ×4 IMPLANT
BOOTIES KNEE HIGH SLOAN (MISCELLANEOUS) ×1 IMPLANT
BSKT STON RTRVL 120 1.9FR (BASKET)
BSKT STON RTRVL GEM 120X11 3FR (BASKET)
BSKT STON RTRVL ZERO TP 2.4FR (BASKET) ×2
CANISTER SUCT LVC 12 LTR MEDI- (MISCELLANEOUS) IMPLANT
CATH CLEAR GEL 3F BACKSTOP (CATHETERS) IMPLANT
CATH INTERMIT  6FR 70CM (CATHETERS) ×2 IMPLANT
CATH URET 5FR 28IN CONE TIP (BALLOONS)
CATH URET 5FR 28IN OPEN ENDED (CATHETERS) IMPLANT
CATH URET 5FR 70CM CONE TIP (BALLOONS) IMPLANT
CATH URET DUAL LUMEN 6-10FR 50 (CATHETERS) IMPLANT
CLOTH BEACON ORANGE TIMEOUT ST (SAFETY) ×3 IMPLANT
ELECT REM PT RETURN 9FT ADLT (ELECTROSURGICAL)
ELECTRODE REM PT RTRN 9FT ADLT (ELECTROSURGICAL) IMPLANT
FIBER LASER TRAC TIP (UROLOGICAL SUPPLIES) ×2 IMPLANT
GLOVE BIO SURGEON STRL SZ7.5 (GLOVE) ×3 IMPLANT
GOWN STRL REUS W/ TWL LRG LVL3 (GOWN DISPOSABLE) ×1 IMPLANT
GOWN STRL REUS W/ TWL XL LVL3 (GOWN DISPOSABLE) ×1 IMPLANT
GOWN STRL REUS W/TWL LRG LVL3 (GOWN DISPOSABLE) ×3
GOWN STRL REUS W/TWL XL LVL3 (GOWN DISPOSABLE) ×3
GUIDEWIRE 0.038 PTFE COATED (WIRE) IMPLANT
GUIDEWIRE ANG ZIPWIRE 038X150 (WIRE) IMPLANT
GUIDEWIRE STR DUAL SENSOR (WIRE) ×4 IMPLANT
IV NS 1000ML (IV SOLUTION) ×27
IV NS 1000ML BAXH (IV SOLUTION) IMPLANT
IV NS IRRIG 3000ML ARTHROMATIC (IV SOLUTION) ×2 IMPLANT
KIT BALLIN UROMAX 15FX10 (LABEL) IMPLANT
KIT BALLN UROMAX 15FX4 (MISCELLANEOUS) IMPLANT
KIT BALLN UROMAX 26 75X4 (MISCELLANEOUS)
KIT ROOM TURNOVER WOR (KITS) ×3 IMPLANT
MANIFOLD NEPTUNE II (INSTRUMENTS) ×2 IMPLANT
SET HIGH PRES BAL DIL (LABEL)
SHEATH ACCESS URETERAL 38CM (SHEATH) IMPLANT
STENT URET 6FRX24 CONTOUR (STENTS) ×4 IMPLANT
TUBE CONNECTING 12'X1/4 (SUCTIONS) ×1
TUBE CONNECTING 12X1/4 (SUCTIONS) ×1 IMPLANT

## 2015-01-30 NOTE — Anesthesia Procedure Notes (Signed)
Procedure Name: Intubation Date/Time: 01/30/2015 12:24 PM Performed by: Tyrone NineSAUVE, Tawyna Pellot F Pre-anesthesia Checklist: Patient identified, Timeout performed, Emergency Drugs available, Suction available and Patient being monitored Patient Re-evaluated:Patient Re-evaluated prior to inductionOxygen Delivery Method: Circle system utilized Preoxygenation: Pre-oxygenation with 100% oxygen Intubation Type: Rapid sequence and Cricoid Pressure applied Grade View: Grade III Tube type: Oral Number of attempts: 3 (R Aubriee Szeto X 2  K Hollis X 1) Placement Confirmation: ETT inserted through vocal cords under direct vision,  breath sounds checked- equal and bilateral and positive ETCO2 Secured at: 20 cm Tube secured with: Tape Dental Injury: Teeth and Oropharynx as per pre-operative assessment and Injury to lip  Difficulty Due To: Difficulty was unanticipated Comments: Anterior Larynx Bottom of V. Cords visualized both times by Jaylah Goodlow /ETT id'd in stomach immediately SaO2 99-100% throughout.

## 2015-01-30 NOTE — Op Note (Signed)
Pre-operative diagnosis :   Bilateral ureteral stones, 5 mm right upper ureteral stone, and 6 mm left lower ureteral calculus  Postoperative diagnosis:  Same  Operation:  Cystourethroscopy, bilateral retrograde pyelogram with interpretation, bilateral ureteroscopy, laser fragmentation of bilateral ureteral calculi, bilateral stone extraction, and bilateral double-J stent placement (6 [place] by 2470 m).  Surgeon:  Kathie RhodesS. Patsi Searsannenbaum, MD  First assistant: None  Anesthesia:  General LMA  Preparation:  After appropriate preprocedure, the patient was brought the operating room, placed on the operating table in the dorsal supine position where general LMA anesthesia was introduced. She was then replaced in the dorsal lithotomy position with pubis was prepped with Betadine solution and draped in usual fashion. The history was reviewed. CT scan was reviewed. Our band was double checked.  Review history:  Hematuria   Active Problems Problems  1. Family history of kidney stones (Z84.1) 2. Microscopic hematuria (R31.29) 3. Nephrolithiasis (N20.0)  History of Present Illness 50 yo female referred by Dr. Yetta BarreJones for further evaluation of hematuria associated with Lt flank pain in October. Hx of kidney stones. She had a KUB on 11/22/14 that showed bilateral kidney stones. Insurance would not approve a CT scan. Non-smoker. Fathre had stones. [place] Ct [place] y[place] CT shows bilateral ureteral stones, Right upper ureteral stone and Left lower ureteral stone.    Statement of  Likelihood of Success: Excellent. TIME-OUT observed.:  Procedure: Cystourethroscopy, was, after urethral dilation to size 30 [place] with the [place] sounds. This allowed for ease of placement of the cystoscope. Following this, right risk upon gram showed the 5 mm stone in the right mid to upper ureter. A 0.038 guidewire was then passed around the stone and coiled in the renal pelvis. Following this, the double bridge  ureteroscope was passed into the right lower ureter, and the right lower ureter was noted to be tortuous. Using a stone basket, the ureteroscope was passed into the mid to upper ureter, and the 5 mm stone was identified and photographed. Using the 200  laser fiber, with laser settings of 0.5/5, the stone was fragmented. The stone basket was broken, and removed, and a second 0 tip stone basket was placed. Fragments of stone were removed, and saved for and out's. Repeat ureteroscopy was a copper's, until all stone was removed. A double-J catheter, measuring 6 [place] by 2470 m was passed and coiled in the renal pelvis and the bladder.  Left retro-Pyelogram revealed 6 mm left lower ureteral stone. A 0.038 guidewire was then passed around the stone, and into the kidney and coiled in the renal pelvis. Photo document mutation was made of the stone once the double bridge ureteroscope was passed into the left ureter. Following this, the same laser fiber was passed into the lower ureter, and the stone fragmented. Pieces of the stone were removed. A left double-J catheter was then placed, by passing the 24 cm 6 French double-J stent over the guidewire, and coiled in the renal pelvis. Following this, the bladder was drained of fluid. The stone fragments were taken for analysis. The patient received IV Tylenol, 1 g, and IV Toradol, 30 mg. She also received IV antibiotic. She was awakened, taken to recovery room in good condition.

## 2015-01-30 NOTE — Anesthesia Postprocedure Evaluation (Signed)
Anesthesia Post Note  Patient: Dorothy ShadeCynthia S Vachon  Procedure(s) Performed: Procedure(s) (LRB): CYSTOSCOPY WITH RETROGRADE PYELOGRAM, URETEROSCOPY AND STENT PLACEMENT (Bilateral) CYSTOSCOPY WITH HOLMIUM LASER LITHOTRIPSY (Bilateral) STONE EXTRACTION WITH BASKET (Bilateral)  Patient location during evaluation: PACU Anesthesia Type: General Level of consciousness: awake and alert Pain management: pain level controlled Vital Signs Assessment: post-procedure vital signs reviewed and stable Respiratory status: spontaneous breathing, nonlabored ventilation, respiratory function stable and patient connected to nasal cannula oxygen Cardiovascular status: blood pressure returned to baseline and stable Postop Assessment: no signs of nausea or vomiting Anesthetic complications: no    Last Vitals:  Filed Vitals:   01/30/15 1515 01/30/15 1658  BP: 138/78 137/76  Pulse: 74 79  Temp:  36.8 C  Resp: 14 16    Last Pain:  Filed Vitals:   01/30/15 1659  PainSc: 3                  Shelton SilvasKevin D Bethann Qualley

## 2015-01-30 NOTE — Transfer of Care (Signed)
Immediate Anesthesia Transfer of Care Note  Patient: Dorothy ShadeCynthia S Gernert  Procedure(s) Performed: Procedure(s): CYSTOSCOPY WITH RETROGRADE PYELOGRAM, URETEROSCOPY AND STENT PLACEMENT (Bilateral) CYSTOSCOPY WITH HOLMIUM LASER LITHOTRIPSY (Bilateral) STONE EXTRACTION WITH BASKET (Bilateral)  Patient Location: PACU  Anesthesia Type:General  Level of Consciousness: awake, alert , oriented and patient cooperative  Airway & Oxygen Therapy: Patient Spontanous Breathing and Patient connected to nasal cannula oxygen  Post-op Assessment: Report given to RN and Post -op Vital signs reviewed and stable  Post vital signs: Reviewed and stable  Last Vitals:  Filed Vitals:   01/30/15 1051  BP: 168/81  Pulse: 75  Temp: 36.7 C  Resp: 14    Complications: No apparent anesthesia complications

## 2015-01-30 NOTE — H&P (Signed)
Reason For Visit Hematuria   Active Problems Problems  1. Family history of kidney stones (Z84.1) 2. Microscopic hematuria (R31.29) 3. Nephrolithiasis (N20.0)  History of Present Illness 50 yo female referred by Dr. Ronnald Ramp for further evaluation of hematuria associated with Lt flank pain in October. Hx of kidney stones. She had a KUB on 11/22/14 that showed bilateral kidney stones. Insurance would not approve a CT scan. Non-smoker. Fathre had stones. [place] Ct many yrs ago [place]   Past Medical History Problems  1. History of cardiac murmur (Z86.79)  Surgical History Problems  1. History of Cesarean Section 2. History of Cesarean Section  Current Meds 1. Flonase SUSP;  Therapy: (Recorded:20Dec2016) to Recorded  Allergies Medication  1. No Known Drug Allergies  Family History Problems  1. Family history of atrial fibrillation (Z82.49) : Mother 2. Family history of kidney stones (Z84.1) : Father 3. Family history of malignant neoplasm of breast (Z80.3) : Mother 4. Family history of Renal cell cancer : Father  Social History Problems    Caffeine use (F15.90)   Married   Never a smoker   Number of children   Occupation  Review of Systems Genitourinary, constitutional, skin, eye, otolaryngeal, hematologic/lymphatic, cardiovascular, pulmonary, endocrine, musculoskeletal, gastrointestinal, neurological and psychiatric system(s) were reviewed and pertinent findings if present are noted and are otherwise negative.  Genitourinary: dysuria and hematuria.  ENT: sinus problems.  Musculoskeletal: back pain.  Neurological: headache.    Vitals Vital Signs [Data Includes: Last 1 Day]  Recorded: 20Dec2016 02:41PM  Height: 5 ft 3 in Weight: 103 lb  BMI Calculated: 18.25 BSA Calculated: 1.46 Blood Pressure: 156 / 95 Heart Rate: 88  Physical Exam Constitutional: Well nourished and well developed . No acute distress.  ENT:. The ears and nose are normal in appearance.   Neck: The appearance of the neck is normal and no neck mass is present.  Pulmonary: No respiratory distress and normal respiratory rhythm and effort.  Abdomen: The abdomen is flat. The abdomen is soft and nontender. No masses are palpated. No CVA tenderness. No hernias are palpable. No hepatosplenomegaly noted.  Lymphatics: The femoral and inguinal nodes are not enlarged or tender.  Skin: Normal skin turgor, no visible rash and no visible skin lesions.  Neuro/Psych:. Mood and affect are appropriate.    Results/Data Urine [Data Includes: Last 1 Day]   [id number]  COLOR STRAW   APPEARANCE CLEAR   SPECIFIC GRAVITY <1.005   pH 7.0   GLUCOSE NEGATIVE   BILIRUBIN NEGATIVE   KETONE NEGATIVE   BLOOD 2+   PROTEIN NEGATIVE   NITRITE NEGATIVE   LEUKOCYTE ESTERASE NEGATIVE   SQUAMOUS EPITHELIAL/HPF 0-5 HPF  WBC NONE SEEN WBC/HPF  RBC 0-2 RBC/HPF  BACTERIA NONE SEEN HPF  CRYSTALS NONE SEEN HPF  CASTS NONE SEEN LPF  Yeast NONE SEEN HPF   Assessment Assessed  1. Microscopic hematuria (R31.29) 2. Nephrolithiasis (N20.0) 3. 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 She needs CT hematuria protocol, and will need Litholink. She will RTC to review.   Plan Microscopic hematuria  1. CREATININE with eGFR; Status:Hold For - Specimen/Data Collection,Appointment;  Requested for:20Dec2016;   1. Cr/gfr  2. CT hematuria protocol  3. Litholink.   Discussion/Summary cc: Scarlette Calico, MD     Signatures Electronically signed by : Carolan Clines, M.D.; Jan 09 2015  3:36PM EST  Reason For Visit Hematuria   Active Problems Problems  1. Family history of kidney stones (  I71.1) 2. Microscopic hematuria (R31.29) 3. Nephrolithiasis (N20.0)  History of Present Illness 50 yo female referred by Dr. Ronnald Ramp for further evaluation of hematuria associated with Lt flank pain in October. Hx of kidney stones. She had a KUB on 11/22/14  that showed bilateral kidney stones. Insurance would not approve a CT scan. Non-smoker. Fathre had stones. [place] Ct many yrs ago [place]   Past Medical History Problems  1. History of cardiac murmur (Z86.79)  Surgical History Problems  1. History of Cesarean Section 2. History of Cesarean Section  Current Meds 1. Flonase SUSP;  Therapy: (Recorded:20Dec2016) to Recorded  Allergies Medication  1. No Known Drug Allergies  Family History Problems  1. Family history of atrial fibrillation (Z82.49) : Mother 2. Family history of kidney stones (Z84.1) : Father 3. Family history of malignant neoplasm of breast (Z80.3) : Mother 4. Family history of Renal cell cancer : Father  Social History Problems    Caffeine use (F15.90)   Married   Never a smoker   Number of children   Occupation  Review of Systems Genitourinary, constitutional, skin, eye, otolaryngeal, hematologic/lymphatic, cardiovascular, pulmonary, endocrine, musculoskeletal, gastrointestinal, neurological and psychiatric system(s) were reviewed and pertinent findings if present are noted and are otherwise negative.  Genitourinary: dysuria and hematuria.  ENT: sinus problems.  Musculoskeletal: back pain.  Neurological: headache.    Vitals Vital Signs [Data Includes: Last 1 Day]  Recorded: 20Dec2016 02:41PM  Height: 5 ft 3 in Weight: 103 lb  BMI Calculated: 18.25 BSA Calculated: 1.46 Blood Pressure: 156 / 95 Heart Rate: 88  Physical Exam Constitutional: Well nourished and well developed . No acute distress.  ENT:. The ears and nose are normal in appearance.  Neck: The appearance of the neck is normal and no neck mass is present.  Pulmonary: No respiratory distress and normal respiratory rhythm and effort.  Abdomen: The abdomen is flat. The abdomen is soft and nontender. No masses are palpated. No CVA tenderness. No hernias are palpable. No hepatosplenomegaly noted.  Lymphatics: The femoral and inguinal nodes  are not enlarged or tender.  Skin: Normal skin turgor, no visible rash and no visible skin lesions.  Neuro/Psych:. Mood and affect are appropriate.    Results/Data Urine [Data Includes: Last 1 Day]   [id number]  COLOR STRAW   APPEARANCE CLEAR   SPECIFIC GRAVITY <1.005   pH 7.0   GLUCOSE NEGATIVE   BILIRUBIN NEGATIVE   KETONE NEGATIVE   BLOOD 2+   PROTEIN NEGATIVE   NITRITE NEGATIVE   LEUKOCYTE ESTERASE NEGATIVE   SQUAMOUS EPITHELIAL/HPF 0-5 HPF  WBC NONE SEEN WBC/HPF  RBC 0-2 RBC/HPF  BACTERIA NONE SEEN HPF  CRYSTALS NONE SEEN HPF  CASTS NONE SEEN LPF  Yeast NONE SEEN HPF   Assessment Assessed  1. Microscopic hematuria (R31.29) 2. Nephrolithiasis (N20.0) 3. 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 She needs CT hematuria protocol, and will need Litholink. She will RTC to review.   Plan Microscopic hematuria  1. CREATININE with eGFR; Status:Hold For - Specimen/Data Collection,Appointment;  Requested for:20Dec2016;   1. Cr/gfr  2. CT hematuria protocol: 42m stone proximal R ureter and 663mstone distal Left ureter.   3. Litholink.   Discussion/Summary cc: ThScarlette CalicoMD     Signatures Electronically signed by : SiCarolan ClinesM.D.; Jan 09 2015  3:36PM EST

## 2015-01-30 NOTE — Discharge Instructions (Addendum)
Kidney Stones °Kidney stones (urolithiasis) are deposits that form inside your kidneys. The intense pain is caused by the stone moving through the urinary tract. When the stone moves, the ureter goes into spasm around the stone. The stone is usually passed in the urine.  °CAUSES  °1. A disorder that makes certain neck glands produce too much parathyroid hormone (primary hyperparathyroidism). °2. A buildup of uric acid crystals, similar to gout in your joints. °3. Narrowing (stricture) of the ureter. °4. A kidney obstruction present at birth (congenital obstruction). °5. Previous surgery on the kidney or ureters. °6. Numerous kidney infections. °SYMPTOMS  °· Feeling sick to your stomach (nauseous). °· Throwing up (vomiting). °· Blood in the urine (hematuria). °· Pain that usually spreads (radiates) to the groin. °· Frequency or urgency of urination. °DIAGNOSIS  °· Taking a history and physical exam. °· Blood or urine tests. °· CT scan. °· Occasionally, an examination of the inside of the urinary bladder (cystoscopy) is performed. °TREATMENT  °· Observation. °· Increasing your fluid intake. °· Extracorporeal shock wave lithotripsy--This is a noninvasive procedure that uses shock waves to break up kidney stones. °· Surgery may be needed if you have severe pain or persistent obstruction. There are various surgical procedures. Most of the procedures are performed with the use of small instruments. Only small incisions are needed to accommodate these instruments, so recovery time is minimized. °The size, location, and chemical composition are all important variables that will determine the proper choice of action for you. Talk to your health care provider to better understand your situation so that you will minimize the risk of injury to yourself and your kidney.  °HOME CARE INSTRUCTIONS  °· Drink enough water and fluids to keep your urine clear or pale yellow. This will help you to pass the stone or stone  fragments. °· Strain all urine through the provided strainer. Keep all particulate matter and stones for your health care provider to see. The stone causing the pain may be as small as a grain of salt. It is very important to use the strainer each and every time you pass your urine. The collection of your stone will allow your health care provider to analyze it and verify that a stone has actually passed. The stone analysis will often identify what you can do to reduce the incidence of recurrences. °· Only take over-the-counter or prescription medicines for pain, discomfort, or fever as directed by your health care provider. °· Keep all follow-up visits as told by your health care provider. This is important. °· Get follow-up X-rays if required. The absence of pain does not always mean that the stone has passed. It may have only stopped moving. If the urine remains completely obstructed, it can cause loss of kidney function or even complete destruction of the kidney. It is your responsibility to make sure X-rays and follow-ups are completed. Ultrasounds of the kidney can show blockages and the status of the kidney. Ultrasounds are not associated with any radiation and can be performed easily in a matter of minutes. °· Make changes to your daily diet as told by your health care provider. You may be told to: °¨ Limit the amount of salt that you eat. °¨ Eat 5 or more servings of fruits and vegetables each day. °¨ Limit the amount of meat, poultry, fish, and eggs that you eat. °· Collect a 24-hour urine sample as told by your health care provider. You may need to collect another urine sample every 6-12   months. °SEEK MEDICAL CARE IF: °· You experience pain that is progressive and unresponsive to any pain medicine you have been prescribed. °SEEK IMMEDIATE MEDICAL CARE IF:  °· Pain cannot be controlled with the prescribed medicine. °· You have a fever or shaking chills. °· The severity or intensity of pain increases over  18 hours and is not relieved by pain medicine. °· You develop a new onset of abdominal pain. °· You feel faint or pass out. °· You are unable to urinate. °  °This information is not intended to replace advice given to you by your health care provider. Make sure you discuss any questions you have with your health care provider. °  °Document Released: 01/06/2005 Document Revised: 09/27/2014 Document Reviewed: 06/09/2012 °Elsevier Interactive Patient Education ©2016 [employer] ° °[facility] °[phone] °Post Ureteroscopy With or Without Stent Instructions ° °Definitions: ° °Ureter: The duct that transports urine from the kidney to the bladder. °Stent:   A plastic hollow tube that is placed into the ureter, from the kidney to the                 bladder to prevent the ureter from swelling shut. ° °GENERAL INSTRUCTIONS: ° °Despite the fact that no skin incisions were used, the area around the ureter and bladder is raw and irritated. The stent is a foreign body which will further irritate the bladder wall. This irritation is manifested by increased frequency of urination, both day and night, and by an increase in the urge to urinate. In some, the urge to urinate is present almost always. Sometimes the urge is strong enough that you may not be able to stop yourself from urinating. The only real cure is to remove the stent and then give time for the bladder wall to heal which can't be done until the danger of the ureter swelling shut has passed, which varies. ° °You may see some blood in your urine while the stent is in place and a few days afterwards. Do not be alarmed, even if the urine was clear for a while. Get off your feet and drink lots of fluids until clearing occurs. If you start to pass clots or don't improve, call us. ° °DIET: °You may return to your normal diet immediately. Because of the raw surface of your bladder, alcohol, spicy foods, acid type foods and drinks with caffeine may cause  irritation or frequency and should be used in moderation. To keep your urine flowing freely and to avoid constipation, drink plenty of fluids during the day ( 8-10 glasses ). °Tip: Avoid cranberry juice because it is very acidic. ° °ACTIVITY: °Your physical activity doesn't need to be restricted. However, if you are very active, you may see some blood in your urine. We suggest that you reduce your activity under these circumstances until the bleeding has stopped. ° °BOWELS: °It is important to keep your bowels regular during the postoperative period. Straining with bowel movements can cause bleeding. A bowel movement every other day is reasonable. Use a mild laxative if needed, such as Milk of Magnesia 2-3 tablespoons, or 2 Dulcolax tablets. Call if you continue to have problems. If you have been taking narcotics for pain, before, during or after your surgery, you may be constipated. Take a laxative if necessary. ° ° °MEDICATION: °You should resume your pre-surgery medications unless told not to. In addition you will often be given an antibiotic to prevent infection. These should be taken as prescribed until the bottles   are finished unless you are having an unusual reaction to one of the drugs. ° °PROBLEMS YOU SHOULD REPORT TO US: °· Fevers over 100.5 Fahrenheit. °· Heavy bleeding, or clots ( See above notes about blood in urine ). °· Inability to urinate. °· Drug reactions ( hives, rash, nausea, vomiting, diarrhea ). °· Severe burning or pain with urination that is not improving. ° °FOLLOW-UP: °You will need a follow-up appointment to monitor your progress. Call for this appointment at the number listed above. Usually the first appointment will be about three to fourteen days after your surgery. ° ° ° °Post Anesthesia Home Care Instructions ° °Activity: °Get plenty of rest for the remainder of the day. A responsible adult should stay with you for 24 hours following the procedure.  °For the next 24 hours, DO  NOT: °-Drive a car °-Operate machinery °-Drink alcoholic beverages °-Take any medication unless instructed by your physician °-Make any legal decisions or sign important papers. ° °Meals: °Start with liquid foods such as gelatin or soup. Progress to regular foods as tolerated. Avoid greasy, spicy, heavy foods. If nausea and/or vomiting occur, drink only clear liquids until the nausea and/or vomiting subsides. Call your physician if vomiting continues. ° °Special Instructions/Symptoms: °Your throat may feel dry or sore from the anesthesia or the breathing tube placed in your throat during surgery. If this causes discomfort, gargle with warm salt water. The discomfort should disappear within 24 hours. ° °If you had a scopolamine patch placed behind your ear for the management of post- operative nausea and/or vomiting: ° °1. The medication in the patch is effective for 72 hours, after which it should be removed.  Wrap patch in a tissue and discard in the trash. Wash hands thoroughly with soap and water. °2. You may remove the patch earlier than 72 hours if you experience unpleasant side effects which may include dry mouth, dizziness or visual disturbances. °3. Avoid touching the patch. Wash your hands with soap and water after contact with the patch. °  ° °

## 2015-01-30 NOTE — Interval H&P Note (Signed)
History and Physical Interval Note:  01/30/2015 12:12 PM  Dorothy ShadeCynthia S Reynolds  has presented today for surgery, with the diagnosis of RIGHT URETERAL STONES UPPER URETER, LEFT LOWER URETERAL STONE  The various methods of treatment have been discussed with the patient and family. After consideration of risks, benefits and other options for treatment, the patient has consented to  Procedure(s): CYSTOSCOPY WITH RETROGRADE PYELOGRAM, URETEROSCOPY AND STENT PLACEMENT (Bilateral) CYSTOSCOPY WITH HOLMIUM LASER LITHOTRIPSY (Bilateral) STONE EXTRACTION WITH BASKET (Bilateral) as a surgical intervention .  The patient's history has been reviewed, patient examined, no change in status, stable for surgery.  I have reviewed the patient's chart and labs.  Questions were answered to the patient's satisfaction.     [name] has not passed stone from either side, and is having general nausea and abdominal pain. She will have bilateral retrograde pyelograms and stone extraction as needed. ( 5mm Right ureteral stone and 6mm Left ureteral stone in December). Possible JJ stents.

## 2015-01-30 NOTE — Anesthesia Preprocedure Evaluation (Addendum)
Anesthesia Evaluation  Patient identified by MRN, date of birth, ID band Patient awake    Reviewed: Allergy & Precautions, NPO status , Patient's Chart, lab work & pertinent test results  Airway Mallampati: II  TM Distance: <3 FB Neck ROM: Full    Dental  (+) Teeth Intact, Dental Advisory Given   Pulmonary neg pulmonary ROS,    breath sounds clear to auscultation       Cardiovascular Exercise Tolerance: Good negative cardio ROS  + Valvular Problems/Murmurs  Rhythm:Regular Rate:Normal     Neuro/Psych Anxiety negative neurological ROS  negative psych ROS   GI/Hepatic Neg liver ROS, GERD  Medicated,Nauseated currently   Endo/Other    Renal/GU Renal diseaseKidney Stones  negative genitourinary   Musculoskeletal negative musculoskeletal ROS (+)   Abdominal   Peds negative pediatric ROS (+)  Hematology   Anesthesia Other Findings   Reproductive/Obstetrics negative OB ROS                         Lab Results  Component Value Date   WBC 4.6 11/15/2014   HGB 12.6 11/15/2014   HCT 38.6 11/15/2014   MCV 87.0 11/15/2014   PLT 223.0 11/15/2014   Lab Results  Component Value Date   CREATININE 0.77 11/15/2014   BUN 14 11/15/2014   NA 140 11/15/2014   K 4.1 11/15/2014   CL 103 11/15/2014   CO2 30 11/15/2014   No results found for: INR, PROTIME  12/2014: EKG: normal sinus rhythm.   Anesthesia Physical Anesthesia Plan  ASA: II  Anesthesia Plan: General   Post-op Pain Management:    Induction: Intravenous  Airway Management Planned: Oral ETT  Additional Equipment:   Intra-op Plan:   Post-operative Plan: Extubation in OR  Informed Consent: I have reviewed the patients History and Physical, chart, labs and discussed the procedure including the risks, benefits and alternatives for the proposed anesthesia with the patient or authorized representative who has indicated his/her  understanding and acceptance.   Dental advisory given  Plan Discussed with: CRNA and Anesthesiologist  Anesthesia Plan Comments: (Pt currently complaining of N/V)      Anesthesia Quick Evaluation

## 2015-01-31 ENCOUNTER — Encounter (HOSPITAL_BASED_OUTPATIENT_CLINIC_OR_DEPARTMENT_OTHER): Payer: Self-pay | Admitting: Urology

## 2015-02-09 ENCOUNTER — Other Ambulatory Visit: Payer: Self-pay

## 2015-02-09 ENCOUNTER — Ambulatory Visit (HOSPITAL_COMMUNITY): Payer: BC Managed Care – PPO | Attending: Cardiovascular Disease

## 2015-02-09 DIAGNOSIS — R011 Cardiac murmur, unspecified: Secondary | ICD-10-CM

## 2015-02-16 ENCOUNTER — Encounter: Payer: Self-pay | Admitting: Internal Medicine

## 2015-02-16 ENCOUNTER — Other Ambulatory Visit: Payer: Self-pay | Admitting: Obstetrics and Gynecology

## 2015-02-16 LAB — HM PAP SMEAR

## 2015-02-19 LAB — CYTOLOGY - PAP

## 2015-03-23 ENCOUNTER — Other Ambulatory Visit: Payer: Self-pay | Admitting: Obstetrics and Gynecology

## 2015-10-18 ENCOUNTER — Other Ambulatory Visit: Payer: Self-pay | Admitting: Obstetrics and Gynecology

## 2015-10-18 LAB — HM PAP SMEAR

## 2015-10-19 LAB — CYTOLOGY - PAP

## 2016-03-19 ENCOUNTER — Ambulatory Visit (INDEPENDENT_AMBULATORY_CARE_PROVIDER_SITE_OTHER): Payer: BC Managed Care – PPO | Admitting: Family Medicine

## 2016-03-19 ENCOUNTER — Encounter: Payer: Self-pay | Admitting: Family Medicine

## 2016-03-19 VITALS — BP 128/86 | HR 84 | Temp 98.3°F | Ht 63.0 in | Wt 102.8 lb

## 2016-03-19 DIAGNOSIS — B9789 Other viral agents as the cause of diseases classified elsewhere: Secondary | ICD-10-CM | POA: Diagnosis not present

## 2016-03-19 DIAGNOSIS — J329 Chronic sinusitis, unspecified: Secondary | ICD-10-CM

## 2016-03-19 MED ORDER — PREDNISONE 20 MG PO TABS: ORAL_TABLET | ORAL | 0 refills | Status: DC

## 2016-03-19 NOTE — Patient Instructions (Signed)
Sinsusitis Viral based on <10 days, no double sickening, lack of severity of symptoms in first 3 days. Educated on signs that bacterial infection may have developed (symptoms over 10 days, double sickening).   Treatment: -symptomatic care with mucinex -considered steroid: we opted to trial 1-2 more days without prednisone but if not improving she will pick up. If does not resolve with that course- will follow up with Dr. Yetta BarreJones to consider antibiotic therapy as would be well over 10 days -Antibiotic indicated: not at present  Finally, we reviewed reasons to return to care including if symptoms worsen or persist or new concerns arise (particularly fever or shortness of breath)  Meds ordered this encounter  Medications  . predniSONE (DELTASONE) 20 MG tablet    Sig: Take 1 tablet by mouth daily for 5 days, then 1/2 tablet daily for 2 days    Dispense:  6 tablet    Refill:  0

## 2016-03-19 NOTE — Progress Notes (Signed)
PCP: Sanda Lingerhomas Jones, MD  Subjective:  [name] is a 51 y.o. year old very pleasant female patient who presents with sinusitis symptoms including nasal congestion but mainly clear, sinus tenderness particularly right frontal -other symptoms include: mild sore throat, some chills -day of illness:6 -Symptoms show no change -previous treatments: none -sick contacts/travel/risks: denies flu exposure.  -Hx of: allergies on flonase  ROS-denies fever, SOB, NVD, tooth pain upper (mild tenderness bottom teeth this AM)  Pertinent Past Medical History-  Patient Active Problem List   Diagnosis Date Noted  . Systolic murmur 01/16/2015  . Nephrolithiasis 11/22/2014  . Routine general medical examination at a health care facility 12/02/2012  . ALLERGIC RHINITIS 01/28/2008  . GERD 01/28/2008    Medications- reviewed  Current Outpatient Prescriptions  Medication Sig Dispense Refill  . fluticasone (FLONASE) 50 MCG/ACT nasal spray Place 1 spray into both nostrils every morning.      No current facility-administered medications for this visit.     Objective: BP 128/86 (BP Location: Left Arm, Patient Position: Sitting, Cuff Size: Normal)   Pulse 84   Temp 98.3 F (36.8 C) (Oral)   Ht 5\' 3"  (1.6 m)   Wt 102 lb 12.8 oz (46.6 kg)   SpO2 96%   BMI 18.21 kg/m  Gen: NAD, resting comfortably HEENT: Turbinates erythematous with yellow drainage, TM normal, pharynx mildly erythematous with no tonsilar exudate or edema, right frontal and left maxillary sinus tenderness CV: RRR no murmurs rubs or gallops Lungs: CTAB no crackles, wheeze, rhonchi Ext: no edema Skin: warm, dry, no rash  Assessment/Plan:  Sinsusitis Viral based on <10 days, no double sickening, lack of severity of symptoms in first 3 days. Educated on signs that bacterial infection may have developed (symptoms over 10 days, double sickening).   Treatment: -symptomatic care with mucinex -considered steroid: we opted to trial 1-2  more days without prednisone but if not improving she will pick up. If does not resolve with that course- will follow up with Dr. Yetta BarreJones to consider antibiotic therapy as would be well over 10 days -Antibiotic indicated: not at present  Finally, we reviewed reasons to return to care including if symptoms worsen or persist or new concerns arise (particularly fever or shortness of breath)  Meds ordered this encounter  Medications  . predniSONE (DELTASONE) 20 MG tablet    Sig: Take 1 tablet by mouth daily for 5 days, then 1/2 tablet daily for 2 days    Dispense:  6 tablet    Refill:  0    Tana ConchStephen Rether Rison, MD

## 2016-03-19 NOTE — Progress Notes (Signed)
Pre visit review using our clinic review tool, if applicable. No additional management support is needed unless otherwise documented below in the visit note. 

## 2016-05-28 ENCOUNTER — Other Ambulatory Visit: Payer: Self-pay | Admitting: Obstetrics and Gynecology

## 2016-05-28 LAB — HM PAP SMEAR

## 2016-05-28 LAB — HM MAMMOGRAPHY

## 2016-05-30 LAB — CYTOLOGY - PAP

## 2016-06-04 ENCOUNTER — Encounter: Payer: Self-pay | Admitting: Internal Medicine

## 2017-03-30 ENCOUNTER — Ambulatory Visit: Payer: BC Managed Care – PPO | Admitting: Family Medicine

## 2017-03-30 ENCOUNTER — Encounter: Payer: Self-pay | Admitting: Family Medicine

## 2017-03-30 VITALS — BP 130/90 | HR 87 | Temp 98.5°F | Wt 102.4 lb

## 2017-03-30 DIAGNOSIS — J029 Acute pharyngitis, unspecified: Secondary | ICD-10-CM | POA: Diagnosis not present

## 2017-03-30 LAB — POCT RAPID STREP A (OFFICE): RAPID STREP A SCREEN: NEGATIVE

## 2017-03-30 NOTE — Patient Instructions (Signed)
Please try things such as zyrtec-D or allegra-D which is an antihistamine and decongestant.   Please try afrin which will help with nasal congestion but use for only three days.   Please also try using a netti pot on a regular occasion.  Honey can help with a sore throat.   Vick's and Delsym can help with cough.  

## 2017-03-30 NOTE — Progress Notes (Signed)
[name] - 52 y.o. female MRN [id number]  Date of birth: 1966-01-01  SUBJECTIVE:  Including CC & ROS.  Chief Complaint  Patient presents with  . Sore Throat  . Cough    [name] is a 52 y.o. female that is cough and sore throat. These remained ongoing for 2 days. Her family members at home has similar symptoms. She had a 101 fever last night. She did receive the flu vaccine. She denies any sweats or body aches or chills. Symptoms have been staying the same. Denies any rashes.   Review of Systems  Constitutional: Negative for fever.  HENT: Positive for congestion, sinus pain and sore throat.   Respiratory: Positive for cough.   Cardiovascular: Negative for chest pain.  Gastrointestinal: Negative for abdominal pain.    HISTORY: Past Medical, Surgical, Social, and Family History Reviewed & Updated per EMR.   Pertinent Historical Findings include:  Past Medical History:  Diagnosis Date  . Allergic rhinitis   . Bilateral ureteral calculi   . History of nephrolithiasis   . Systolic murmur    PER PCP NOTE 10/ 2016--  asymptomatic  . Wears glasses     Past Surgical History:  Procedure Laterality Date  . ACHILLES TENDON LENGTHENING Bilateral age 55  . CESAREAN SECTION  10-04-2005  &  03-20-2009   Bilateral Tubal Ligation w/ last one  . CYSTOSCOPY WITH HOLMIUM LASER LITHOTRIPSY Bilateral 01/30/2015   Procedure: CYSTOSCOPY WITH HOLMIUM LASER LITHOTRIPSY;  Surgeon: Jethro Bolus, MD;  Location: [facility];  Service: Urology;  Laterality: Bilateral;  . CYSTOSCOPY WITH RETROGRADE PYELOGRAM, URETEROSCOPY AND STENT PLACEMENT Bilateral 01/30/2015   Procedure: CYSTOSCOPY WITH RETROGRADE PYELOGRAM, URETEROSCOPY AND STENT PLACEMENT;  Surgeon: Jethro Bolus, MD;  Location: [facility];  Service: Urology;  Laterality: Bilateral;  . STONE EXTRACTION WITH BASKET Bilateral 01/30/2015   Procedure: STONE EXTRACTION WITH BASKET;  Surgeon: Jethro Bolus, MD;  Location: [facility];  Service: Urology;  Laterality: Bilateral;    Allergies  Allergen Reactions  . Keflex [Cephalexin] Hives    Family History  Problem Relation Age of Onset  . Breast cancer Mother   . Hypertension Father   . COPD Father   . Diabetes Neg Hx   . Early death Neg Hx   . Heart disease Neg Hx   . Hyperlipidemia Neg Hx   . Stroke Neg Hx   . Colon cancer Neg Hx   . Alcohol abuse Neg Hx      Social History   Socioeconomic History  . Marital status: Married    Spouse name: Not on file  . Number of children: Not on file  . Years of education: Not on file  . Highest education level: Not on file  Social Needs  . Financial resource strain: Not on file  . Food insecurity - worry: Not on file  . Food insecurity - inability: Not on file  . Transportation needs - medical: Not on file  . Transportation needs - non-medical: Not on file  Occupational History  . Not on file  Tobacco Use  . Smoking status: Never Smoker  . Smokeless tobacco: Never Used  Substance and Sexual Activity  . Alcohol use: No  . Drug use: No  . Sexual activity: Not on file  Other Topics Concern  . Not on file  Social History Narrative  . Not on file     PHYSICAL EXAM:  VS: BP 130/90 (BP Location: Left Arm,  Patient Position: Sitting, Cuff Size: Normal)   Pulse 87   Temp 98.5 F (36.9 C) (Oral)   Wt 102 lb 6.4 oz (46.4 kg)   SpO2 98%   BMI 18.14 kg/m  Physical Exam Gen: NAD, alert, cooperative with exam,  ENT: normal lips, normal nasal mucosa, tympanic membranes clear and intact bilaterally, normal oropharynx, no cervical lymphadenopathy Eye: normal EOM, normal conjunctiva and lids CV:  no edema, +2 pedal pulses, regular rate and rhythm, S1-S2   Resp: no accessory muscle use, non-labored, clear to auscultation bilaterally, no crackles or wheezes Skin: no rashes, no areas of induration  Neuro: normal tone, normal sensation to touch Psych:  normal  insight, alert and oriented MSK: Normal gait, normal strength       ASSESSMENT & PLAN:   Sore throat Rapid strep negative. Does have sick contacts at home. Possible for viral. - Counseled on supportive care. - Provided a work up.  - Given indications the follow-up.

## 2017-03-30 NOTE — Assessment & Plan Note (Signed)
Rapid strep negative. Does have sick contacts at home. Possible for viral. - Counseled on supportive care. - Provided a work up.  - Given indications the follow-up.

## 2017-09-25 LAB — HM PAP SMEAR

## 2017-10-02 ENCOUNTER — Encounter: Payer: Self-pay | Admitting: Internal Medicine

## 2018-10-27 LAB — HM MAMMOGRAPHY: HM Mammogram: NORMAL (ref 0–4)

## 2019-02-08 LAB — HM PAP SMEAR

## 2019-02-08 LAB — HM MAMMOGRAPHY

## 2019-06-30 ENCOUNTER — Ambulatory Visit (INDEPENDENT_AMBULATORY_CARE_PROVIDER_SITE_OTHER): Payer: BC Managed Care – PPO | Admitting: Internal Medicine

## 2019-06-30 ENCOUNTER — Other Ambulatory Visit: Payer: Self-pay

## 2019-06-30 ENCOUNTER — Encounter: Payer: Self-pay | Admitting: Internal Medicine

## 2019-06-30 VITALS — BP 150/90 | HR 103 | Temp 98.3°F | Resp 16 | Ht 63.0 in | Wt 101.2 lb

## 2019-06-30 DIAGNOSIS — I10 Essential (primary) hypertension: Secondary | ICD-10-CM | POA: Diagnosis not present

## 2019-06-30 DIAGNOSIS — Z8781 Personal history of (healed) traumatic fracture: Secondary | ICD-10-CM | POA: Diagnosis not present

## 2019-06-30 DIAGNOSIS — L219 Seborrheic dermatitis, unspecified: Secondary | ICD-10-CM | POA: Diagnosis not present

## 2019-06-30 DIAGNOSIS — Z0000 Encounter for general adult medical examination without abnormal findings: Secondary | ICD-10-CM | POA: Diagnosis not present

## 2019-06-30 DIAGNOSIS — E559 Vitamin D deficiency, unspecified: Secondary | ICD-10-CM | POA: Insufficient documentation

## 2019-06-30 DIAGNOSIS — Z1211 Encounter for screening for malignant neoplasm of colon: Secondary | ICD-10-CM

## 2019-06-30 DIAGNOSIS — M810 Age-related osteoporosis without current pathological fracture: Secondary | ICD-10-CM

## 2019-06-30 LAB — TSH: TSH: 0.71 u[IU]/mL (ref 0.35–4.50)

## 2019-06-30 LAB — BASIC METABOLIC PANEL
BUN: 18 mg/dL (ref 6–23)
CO2: 29 mEq/L (ref 19–32)
Calcium: 9.6 mg/dL (ref 8.4–10.5)
Chloride: 105 mEq/L (ref 96–112)
Creatinine, Ser: 0.84 mg/dL (ref 0.40–1.20)
GFR: 70.93 mL/min (ref >60.00)
Glucose, Bld: 99 mg/dL (ref 70–99)
Potassium: 4.4 mEq/L (ref 3.5–5.1)
Sodium: 140 mEq/L (ref 135–145)

## 2019-06-30 LAB — HEPATIC FUNCTION PANEL
ALT: 9 U/L (ref 0–35)
AST: 13 U/L (ref 0–37)
Albumin: 4.5 g/dL (ref 3.5–5.2)
Alkaline Phosphatase: 64 U/L (ref 39–117)
Bilirubin, Direct: 0.1 mg/dL (ref 0.0–0.3)
Total Bilirubin: 0.4 mg/dL (ref 0.2–1.2)
Total Protein: 6.9 g/dL (ref 6.0–8.3)

## 2019-06-30 LAB — CBC WITH DIFFERENTIAL/PLATELET
Basophils Absolute: 0 10*3/uL (ref 0.0–0.1)
Basophils Relative: 0.7 % (ref 0.0–3.0)
Eosinophils Absolute: 0.1 10*3/uL (ref 0.0–0.7)
Eosinophils Relative: 2.7 % (ref 0.0–5.0)
HCT: 40.2 % (ref 36.0–46.0)
Hemoglobin: 13.3 g/dL (ref 12.0–15.0)
Lymphocytes Relative: 29.4 % (ref 12.0–46.0)
Lymphs Abs: 1.5 10*3/uL (ref 0.7–4.0)
MCHC: 33.1 g/dL (ref 30.0–36.0)
MCV: 89.4 fl (ref 78.0–100.0)
Monocytes Absolute: 0.4 10*3/uL (ref 0.1–1.0)
Monocytes Relative: 7.9 % (ref 3.0–12.0)
Neutro Abs: 3 10*3/uL (ref 1.4–7.7)
Neutrophils Relative %: 59.3 % (ref 43.0–77.0)
Platelets: 252 10*3/uL (ref 150.0–400.0)
RBC: 4.5 Mil/uL (ref 3.87–5.11)
RDW: 13.3 % (ref 11.5–15.5)
WBC: 5 10*3/uL (ref 4.0–10.5)

## 2019-06-30 LAB — URINALYSIS, ROUTINE W REFLEX MICROSCOPIC
Bilirubin Urine: NEGATIVE
Ketones, ur: NEGATIVE
Leukocytes,Ua: NEGATIVE
Nitrite: NEGATIVE
Specific Gravity, Urine: 1.025 (ref 1.000–1.030)
Total Protein, Urine: NEGATIVE
Urine Glucose: NEGATIVE
Urobilinogen, UA: 1 (ref 0.0–1.0)
pH: 6.5 (ref 5.0–8.0)

## 2019-06-30 LAB — LIPID PANEL
Cholesterol: 188 mg/dL (ref 0–200)
HDL: 74.9 mg/dL (ref >39.00)
LDL Cholesterol: 96 mg/dL (ref 0–99)
NonHDL: 112.72
Total CHOL/HDL Ratio: 3
Triglycerides: 85 mg/dL (ref 0.0–149.0)
VLDL: 17 mg/dL (ref 0.0–40.0)

## 2019-06-30 LAB — VITAMIN D 25 HYDROXY (VIT D DEFICIENCY, FRACTURES): VITD: 19.07 ng/mL — ABNORMAL LOW (ref 30.00–100.00)

## 2019-06-30 MED ORDER — DESONIDE 0.05 % EX LOTN: TOPICAL_LOTION | Freq: Two times a day (BID) | CUTANEOUS | 2 refills | Status: AC

## 2019-06-30 MED ORDER — IRBESARTAN 150 MG PO TABS: 150.0000 mg | ORAL_TABLET | Freq: Every day | ORAL | 1 refills | Status: DC

## 2019-06-30 MED ORDER — CHOLECALCIFEROL 125 MCG (5000 UT) PO CAPS: 5000.0000 [IU] | ORAL_CAPSULE | Freq: Every day | ORAL | 1 refills | Status: DC

## 2019-06-30 MED ORDER — FLUCONAZOLE 100 MG PO TABS: 200.0000 mg | ORAL_TABLET | ORAL | 0 refills | Status: AC

## 2019-06-30 NOTE — Patient Instructions (Signed)
Seborrheic Dermatitis, Adult Seborrheic dermatitis is a skin disease that causes red, scaly patches. It usually occurs on the scalp, and it is often called dandruff. The patches may appear on other parts of the body. Skin patches tend to appear where there are many oil glands in the skin. Areas of the body that are commonly affected include:  Scalp.  Skin folds of the body.  Ears.  Eyebrows.  Neck.  Face.  Armpits.  The bearded area of men's faces. The condition may come and go for no known reason, and it is often long-lasting (chronic). What are the causes? The cause of this condition is not known. What increases the risk? This condition is more likely to develop in people who:  Have certain conditions, such as: ? HIV (human immunodeficiency virus). ? AIDS (acquired immunodeficiency syndrome). ? Parkinson disease. ? Mood disorders, such as depression.  Are 40-60 years old. What are the signs or symptoms? Symptoms of this condition include:  Thick scales on the scalp.  Redness on the face or in the armpits.  Skin that is flaky. The flakes may be white or yellow.  Skin that seems oily or dry but is not helped with moisturizers.  Itching or burning in the affected areas. How is this diagnosed? This condition is diagnosed with a medical history and physical exam. A sample of your skin may be tested (skin biopsy). You may need to see a skin specialist (dermatologist). How is this treated? There is no cure for this condition, but treatment can help to manage the symptoms. You may get treatment to remove scales, lower the risk of skin infection, and reduce swelling or itching. Treatment may include:  Creams that reduce swelling and irritation (steroids).  Creams that reduce skin yeast.  Medicated shampoo, soaps, moisturizing creams, or ointments.  Medicated moisturizing creams or ointments. Follow these instructions at home:  Apply over-the-counter and prescription  medicines only as told by your health care provider.  Use any medicated shampoo, soaps, skin creams, or ointments only as told by your health care provider.  Keep all follow-up visits as told by your health care provider. This is important. Contact a health care provider if:  Your symptoms do not improve with treatment.  Your symptoms get worse.  You have new symptoms. This information is not intended to replace advice given to you by your health care provider. Make sure you discuss any questions you have with your health care provider. Document Revised: 12/19/2016 Document Reviewed: 04/26/2015 Elsevier Patient Education  2020 Elsevier Inc.  

## 2019-06-30 NOTE — Progress Notes (Signed)
Subjective:  Patient ID: [name], female    DOB: 07/20/1965  Age: 54 y.o. MRN: [id number]  CC: Annual Exam, Hypertension, and Rash  This visit occurred during the SARS-CoV-2 public health emergency.  Safety protocols were in place, including screening questions prior to the visit, additional usage of staff PPE, and extensive cleaning of exam room while observing appropriate contact time as indicated for disinfecting solutions.    HPI [name] presents for a CPX.  She complains of a 3-week history of facial rash that is asymptomatic.  She does not like the appearance of it though.  She is not applying onto her face that could have triggered this.  She has not tried any treatment options.  She has a history of hypertension and is not currently taking an antihypertensive.  She is active and denies any recent episodes of chest pain or shortness of breath.  She denies headache, blurred vision, palpitations, edema, or fatigue.  Outpatient Medications Prior to Visit  Medication Sig Dispense Refill  . estradiol (ESTRACE) 0.5 MG tablet estradiol 0.5 mg tablet  TAKE 1 TABLET BY MOUTH EVERY DAY    . fluticasone (FLONASE) 50 MCG/ACT nasal spray Place 1 spray into both nostrils every morning.     . predniSONE (DELTASONE) 20 MG tablet Take 1 tablet by mouth daily for 5 days, then 1/2 tablet daily for 2 days 6 tablet 0   No facility-administered medications prior to visit.    ROS Review of Systems  Constitutional: Negative.  Negative for chills, diaphoresis, fatigue and fever.  HENT: Negative.   Eyes: Negative.   Respiratory: Negative for cough, chest tightness, shortness of breath and wheezing.   Cardiovascular: Negative for chest pain, palpitations and leg swelling.  Gastrointestinal: Negative for abdominal pain, constipation, diarrhea, nausea and vomiting.  Endocrine: Negative.   Genitourinary: Negative.  Negative for difficulty urinating.  Musculoskeletal: Negative.   Skin:  Positive for rash. Negative for color change.  Neurological: Negative.  Negative for dizziness, syncope, speech difficulty, weakness, light-headedness, numbness and headaches.  Hematological: Negative for adenopathy. Does not bruise/bleed easily.  Psychiatric/Behavioral: Negative.     Objective:  BP (!) 150/90 (BP Location: Left Arm, Patient Position: Sitting, Cuff Size: Normal)   Pulse (!) 103   Temp 98.3 F (36.8 C) (Oral)   Resp 16   Ht 5\' 3"  (1.6 m)   Wt 101 lb 4 oz (45.9 kg)   SpO2 99%   BMI 17.94 kg/m   BP Readings from Last 3 Encounters:  06/30/19 (!) 150/90  03/30/17 130/90  03/19/16 128/86    Wt Readings from Last 3 Encounters:  06/30/19 101 lb 4 oz (45.9 kg)  03/30/17 102 lb 6.4 oz (46.4 kg)  03/19/16 102 lb 12.8 oz (46.6 kg)    Physical Exam Vitals reviewed.  Constitutional:      Appearance: Normal appearance.  HENT:     Nose: Nose normal.     Mouth/Throat:     Mouth: Mucous membranes are moist.  Eyes:     General: No scleral icterus.    Conjunctiva/sclera: Conjunctivae normal.  Cardiovascular:     Rate and Rhythm: Normal rate and regular rhythm.     Heart sounds: No murmur heard.      Comments: EKG - NSR, 88 bpm ?? LAE Pulmonary:     Effort: Pulmonary effort is normal.     Breath sounds: No stridor. No wheezing, rhonchi or rales.  Abdominal:     General: Abdomen is  flat.     Palpations: There is no mass.     Tenderness: There is no abdominal tenderness. There is no guarding.  Musculoskeletal:     Cervical back: Neck supple.     Right lower leg: No edema.     Left lower leg: No edema.  Lymphadenopathy:     Cervical: No cervical adenopathy.  Skin:    General: Skin is warm and dry.     Comments: There is an erythematous maculopapular rash with some coalescence and scaling symmetrically over the lateral aspects of the face and extending onto the neck and over the anterior chest wall.  There is also some involvement of the nasolabial folds where  there is a greasy scaly appearance.  There is no involvement of the eyebrows or the eyelids.  There is no involvement of the nose or mouth.  Neurological:     General: No focal deficit present.     Mental Status: She is alert.  Psychiatric:        Mood and Affect: Mood normal.        Behavior: Behavior normal.     Lab Results  Component Value Date   WBC 5.0 06/30/2019   HGB 13.3 06/30/2019   HCT 40.2 06/30/2019   PLT 252.0 06/30/2019   GLUCOSE 99 06/30/2019   CHOL 188 06/30/2019   TRIG 85.0 06/30/2019   HDL 74.90 06/30/2019   LDLCALC 96 06/30/2019   ALT 9 06/30/2019   AST 13 06/30/2019   NA 140 06/30/2019   K 4.4 06/30/2019   CL 105 06/30/2019   CREATININE 0.84 06/30/2019   BUN 18 06/30/2019   CO2 29 06/30/2019   TSH 0.71 06/30/2019    No results found.  Assessment & Plan:   Mazy was seen today for annual exam, hypertension and rash.  Diagnoses and all orders for this visit:  History of fracture due to fall- She has moderately severe osteoporosis. -     DG Bone Density; Future -     VITAMIN D 25 Hydroxy (Vit-D Deficiency, Fractures); Future -     VITAMIN D 25 Hydroxy (Vit-D Deficiency, Fractures)  Colon cancer screening -     Cancel: Ambulatory referral to Gastroenterology  Routine general medical examination at a health care facility- Exam completed, labs reviewed - statin therapy is not indicated, vaccines reviewed and updated, screening for breast and cervical cancer is up-to-date, I ordered Cologuard to screen for colon cancer/polyps, patient education material was given. -     Lipid panel; Future -     Lipid panel  Essential hypertension- Her blood pressure is not adequately well controlled.  Work-up for secondary causes or endorgan damage is unremarkable.  I recommended that she start taking an ARB for this. -     CBC with Differential/Platelet; Future -     Basic metabolic panel; Future -     TSH; Future -     Urinalysis, Routine w reflex microscopic;  Future -     Hepatic function panel; Future -     VITAMIN D 25 Hydroxy (Vit-D Deficiency, Fractures); Future -     EKG 12-Lead -     CBC with Differential/Platelet -     Basic metabolic panel -     TSH -     Urinalysis, Routine w reflex microscopic -     Hepatic function panel -     VITAMIN D 25 Hydroxy (Vit-D Deficiency, Fractures) -     irbesartan (AVAPRO) 150 MG  tablet; Take 1 tablet (150 mg total) by mouth daily.  Acute seborrheic dermatitis -     fluconazole (DIFLUCAN) 100 MG tablet; Take 2 tablets (200 mg total) by mouth once a week. -     desonide (DESOWEN) 0.05 % lotion; Apply topically 2 (two) times daily.  Vitamin D deficiency disease -     Cholecalciferol 125 MCG (5000 UT) capsule; Take 1 capsule (5,000 Units total) by mouth daily.  Age-related osteoporosis without current pathological fracture- Will treat the vitamin D deficiency and have asked her to start receiving her Prolia injection every 6 months.   I have discontinued Henli Hey. Wedekind's predniSONE. I am also having her start on fluconazole, desonide, Cholecalciferol, and irbesartan. Additionally, I am having her maintain her fluticasone and estradiol.  Meds ordered this encounter  Medications  . fluconazole (DIFLUCAN) 100 MG tablet    Sig: Take 2 tablets (200 mg total) by mouth once a week.    Dispense:  8 tablet    Refill:  0  . desonide (DESOWEN) 0.05 % lotion    Sig: Apply topically 2 (two) times daily.    Dispense:  118 mL    Refill:  2  . Cholecalciferol 125 MCG (5000 UT) capsule    Sig: Take 1 capsule (5,000 Units total) by mouth daily.    Dispense:  90 capsule    Refill:  1  . irbesartan (AVAPRO) 150 MG tablet    Sig: Take 1 tablet (150 mg total) by mouth daily.    Dispense:  90 tablet    Refill:  1   In addition to time spent on CPE, I spent 50 minutes in preparing to see the patient by review of recent labs, imaging and procedures, obtaining and reviewing separately obtained history,  communicating with the patient and family or caregiver, ordering medications, tests or procedures, and documenting clinical information in the EHR including the differential Dx, treatment, and any further evaluation and other management of 1. History of fracture due to fall 2. Essential hypertension 3. Acute seborrheic dermatitis 4. Vitamin D deficiency disease 5. Age-related osteoporosis without current pathological fracture    Follow-up: Return in about 3 months (around 09/30/2019).  Sanda Linger, MD

## 2019-07-01 ENCOUNTER — Encounter: Payer: Self-pay | Admitting: Radiology

## 2019-07-01 ENCOUNTER — Ambulatory Visit (INDEPENDENT_AMBULATORY_CARE_PROVIDER_SITE_OTHER)
Admission: RE | Admit: 2019-07-01 | Discharge: 2019-07-01 | Disposition: A | Discharge status: Home / Self Care | Payer: BC Managed Care – PPO | Source: Ambulatory Visit | Attending: Internal Medicine | Admitting: Internal Medicine

## 2019-07-01 DIAGNOSIS — Z8781 Personal history of (healed) traumatic fracture: Secondary | ICD-10-CM | POA: Diagnosis not present

## 2019-07-01 LAB — HM DEXA SCAN: HM Dexa Scan: -3.2

## 2019-07-02 DIAGNOSIS — M810 Age-related osteoporosis without current pathological fracture: Secondary | ICD-10-CM | POA: Insufficient documentation

## 2019-07-04 NOTE — Addendum Note (Signed)
Addended by: Verlan Friends on: 07/04/2019 07:35 AM   Modules accepted: Orders

## 2019-07-05 NOTE — Progress Notes (Signed)
Insurance has been submitted for Prolia. Will contact patient to schedule once benefits have come back.

## 2019-07-07 ENCOUNTER — Encounter: Payer: Self-pay | Admitting: Internal Medicine

## 2019-07-08 ENCOUNTER — Other Ambulatory Visit: Payer: Self-pay | Admitting: Obstetrics and Gynecology

## 2019-07-08 DIAGNOSIS — N6320 Unspecified lump in the left breast, unspecified quadrant: Secondary | ICD-10-CM

## 2019-08-01 ENCOUNTER — Other Ambulatory Visit: Payer: Self-pay | Admitting: Internal Medicine

## 2019-08-01 DIAGNOSIS — M810 Age-related osteoporosis without current pathological fracture: Secondary | ICD-10-CM

## 2019-08-01 MED ORDER — RISEDRONATE SODIUM 35 MG PO TABS: 35.0000 mg | ORAL_TABLET | ORAL | 1 refills | Status: DC

## 2019-08-03 ENCOUNTER — Ambulatory Visit
Admission: RE | Admit: 2019-08-03 | Discharge: 2019-08-03 | Disposition: A | Discharge status: Home / Self Care | Payer: BC Managed Care – PPO | Source: Ambulatory Visit | Attending: Obstetrics and Gynecology | Admitting: Obstetrics and Gynecology

## 2019-08-03 ENCOUNTER — Other Ambulatory Visit: Payer: Self-pay

## 2019-08-03 DIAGNOSIS — N6320 Unspecified lump in the left breast, unspecified quadrant: Secondary | ICD-10-CM

## 2019-08-10 LAB — FECAL OCCULT BLOOD, GUAIAC: Fecal Occult Blood: NEGATIVE

## 2019-08-16 LAB — COLOGUARD
COLOGUARD: NEGATIVE
Cologuard: NEGATIVE

## 2019-08-19 ENCOUNTER — Encounter: Payer: Self-pay | Admitting: Internal Medicine

## 2019-09-19 ENCOUNTER — Telehealth: Payer: Self-pay | Admitting: Internal Medicine

## 2019-09-19 NOTE — Telephone Encounter (Signed)
Patient was recommended to try Prolia or Evenity based on recent bone density and past fracture. Prolia was originally denied and patient was prescribed Actonel.  I have run for [employer] and received approval after completing a prior authorization.  If Dr Yetta Barre would like for patient to try Evenity (given once a month for 12 months) she is okay to schedule at this time.  I can also try to resend and complete a PA for Prolia (which would be approved being that she has now tried Actonel) if Dr Yetta Barre would like to proceed with that as previously mentioned.   Please advise.

## 2019-09-20 NOTE — Telephone Encounter (Signed)
Called pt, LVM.   Pt needs to be scheduled for an OV or nurse visit.

## 2019-12-22 ENCOUNTER — Other Ambulatory Visit: Payer: Self-pay | Admitting: Internal Medicine

## 2019-12-22 DIAGNOSIS — I10 Essential (primary) hypertension: Secondary | ICD-10-CM

## 2020-01-18 ENCOUNTER — Other Ambulatory Visit: Payer: Self-pay | Admitting: Internal Medicine

## 2020-01-18 DIAGNOSIS — M810 Age-related osteoporosis without current pathological fracture: Secondary | ICD-10-CM

## 2020-03-23 LAB — HM MAMMOGRAPHY: HM Mammogram: NORMAL (ref 0–4)

## 2020-03-23 LAB — HM PAP SMEAR

## 2020-06-12 ENCOUNTER — Telehealth (INDEPENDENT_AMBULATORY_CARE_PROVIDER_SITE_OTHER): Payer: BC Managed Care – PPO | Admitting: Nurse Practitioner

## 2020-06-12 ENCOUNTER — Other Ambulatory Visit (HOSPITAL_COMMUNITY): Payer: Self-pay

## 2020-06-12 DIAGNOSIS — U071 COVID-19: Secondary | ICD-10-CM | POA: Diagnosis not present

## 2020-06-12 DIAGNOSIS — I10 Essential (primary) hypertension: Secondary | ICD-10-CM | POA: Diagnosis not present

## 2020-06-12 MED ORDER — PREDNISONE 20 MG PO TABS
20.0000 mg | ORAL_TABLET | Freq: Every day | ORAL | 0 refills | Status: AC
Start: 2020-06-12 — End: 2020-06-17
  Filled 2020-06-12: qty 5, 5d supply, fill #0

## 2020-06-12 MED ORDER — MOLNUPIRAVIR EUA 200MG CAPSULE
4.0000 | ORAL_CAPSULE | Freq: Two times a day (BID) | ORAL | 0 refills | Status: AC
  Filled 2020-06-12: qty 40, 5d supply, fill #0

## 2020-06-12 NOTE — Patient Instructions (Addendum)
Covid 19 Cough Sore throat:   Stay well hydrated  Stay active  Deep breathing exercises  May take tylenol or fever or pain  May take mucinex  twice daily   Will order molnupiravir - no recent labs in chart  Will order prednisone    Follow up:  Follow up in 2 weeks or sooner if needed

## 2020-06-12 NOTE — Progress Notes (Signed)
Virtual Visit via Telephone Note  I connected with [name] on 06/12/20 at  2:30 PM EDT by telephone and verified that I am speaking with the correct person using two identifiers.  Location: Patient: home Provider: office   I discussed the limitations, risks, security and privacy concerns of performing an evaluation and management service by telephone and the availability of in person appointments. I also discussed with the patient that there may be a patient responsible charge related to this service. The patient expressed understanding and agreed to proceed.   History of Present Illness:  Patient presents today for post-COVID care clinic visit through televisit.  Patient states that her symptoms started on Jun 10, 2020.  She tested positive for COVID this morning through home COVID test.  Her husband and both children are also positive.  She has been having extreme sore throat, shortness of breath with exertion, cough, chest congestion.  We discussed that we can order oral antiviral therapy for her since she does have a history of hypertension.  Patient was last lab work was 1 year ago.  We will order molnupiravir.      Observations/Objective:  Vitals with BMI 06/30/2019 03/30/2017 03/19/2016  Height 5\' 3"  - 5\' 3"   Weight 101 lbs 4 oz 102 lbs 6 oz 102 lbs 13 oz  BMI 17.94 - 18.2  Systolic 150 130  Diastolic 90 90 86  Pulse 103 87 84      Assessment and Plan:  Covid 19 Cough Sore throat:   Stay well hydrated  Stay active  Deep breathing exercises  May take tylenol or fever or pain  May take mucinex  twice daily   Will order molnupiravir - no recent labs in chart  Will order prednisone    Follow up:  Follow up in 2 weeks or sooner if needed      I discussed the assessment and treatment plan with the patient. The patient was provided an opportunity to ask questions and all were answered. The patient agreed with the plan and demonstrated an  understanding of the instructions.   The patient was advised to call back or seek an in-person evaluation if the symptoms worsen or if the condition fails to improve as anticipated.  I provided 23 minutes of non-face-to-face time during this encounter.   , NP

## 2020-06-26 ENCOUNTER — Ambulatory Visit (INDEPENDENT_AMBULATORY_CARE_PROVIDER_SITE_OTHER): Payer: BC Managed Care – PPO | Admitting: Nurse Practitioner

## 2020-06-26 VITALS — BP 90/59 | HR 80 | Temp 97.9°F | Resp 18

## 2020-06-26 DIAGNOSIS — Z8616 Personal history of COVID-19: Secondary | ICD-10-CM | POA: Diagnosis not present

## 2020-06-26 NOTE — Patient Instructions (Addendum)
Covid 19 Cough:   Stay well hydrated  Stay active  Deep breathing exercises  May take tylenol or fever or pain  May take mucinex DM twice daily  May start zyrtec     Follow up:  Follow up in 2 weeks or sooner if needed

## 2020-06-26 NOTE — Assessment & Plan Note (Signed)
Cough:   Stay well hydrated  Stay active  Deep breathing exercises  May take tylenol or fever or pain  May take mucinex DM twice daily  May start zyrtec     Follow up:  Follow up in 2 weeks or sooner if needed

## 2020-06-26 NOTE — Progress Notes (Signed)
@Patient  ID: , female    DOB: 1965-11-03, 55 y.o.   MRN: [id number]  Chief Complaint  Patient presents with  . Follow-up    Referring provider: [id number], MD     HPI Patient presents today for post-COVID care clinic visit.  Patient was last seen in our office on 06/12/2020 through televisit.  She was prescribed oral antiviral therapy for COVID and prednisone.  Patient states that she has much improved.  She does still have slight cough.  She denies any significant shortness of breath or chest congestion.  She denies any recent significant fever. Denies f/c/s, n/v/d, hemoptysis, PND, chest pain or edema.       Allergies  Allergen Reactions  . Keflex [Cephalexin] Hives    Immunization History  Administered Date(s) Administered  . Influenza,inj,Quad PF,6+ Mos 12/02/2012, 01/16/2015  . Influenza-Unspecified 11/21/2015  . Td 01/20/2001  . Tdap 11/15/2014    Past Medical History:  Diagnosis Date  . Allergic rhinitis   . Bilateral ureteral calculi   . History of nephrolithiasis   . Systolic murmur    PER PCP NOTE 10/ 2016--  asymptomatic  . Wears glasses     Tobacco History: Social History   Tobacco Use  Smoking Status Never Smoker  Smokeless Tobacco Never Used   Counseling given: Yes   Outpatient Encounter Medications as of 06/26/2020  Medication Sig  . Cholecalciferol 125 MCG (5000 UT) capsule Take 1 capsule (5,000 Units total) by mouth daily.  08/26/2020 desonide (DESOWEN) 0.05 % lotion Apply topically 2 (two) times daily.  [name] Kitchen estradiol (ESTRACE) 0.5 MG tablet estradiol 0.5 mg tablet  TAKE 1 TABLET BY MOUTH EVERY DAY  . fluticasone (FLONASE) 50 MCG/ACT nasal spray Place 1 spray into both nostrils every morning.   . irbesartan (AVAPRO) 150 MG tablet TAKE 1 TABLET BY MOUTH EVERY DAY  . risedronate (ACTONEL) 35 MG tablet TAKE 1 TABLET BY MOUTH EVERY 7 DAYS. WITH WATER ON EMPTY STOMACH, NOTHING BY MOUTH OR LIE DOWN FOR NEXT 30 MINUTES.   No  facility-administered encounter medications on file as of 06/26/2020.     Review of Systems  Review of Systems  Constitutional: Negative.  Negative for fatigue and fever.  HENT: Negative.   Respiratory: Positive for cough. Negative for shortness of breath.   Cardiovascular: Negative.  Negative for chest pain, palpitations and leg swelling.  Gastrointestinal: Negative.   Allergic/Immunologic: Negative.   Neurological: Negative.   Psychiatric/Behavioral: Negative.        Physical Exam  BP (!) 90/59   Pulse 80   Temp 97.9 F (36.6 C)   Resp 18   LMP 12/26/2014 (Approximate)   SpO2 98%   Wt Readings from Last 5 Encounters:  06/30/19 101 lb 4 oz (45.9 kg)  03/30/17 102 lb 6.4 oz (46.4 kg)  03/19/16 102 lb 12.8 oz (46.6 kg)  01/30/15 104 lb (47.2 kg)  01/16/15 103 lb (46.7 kg)     Physical Exam Vitals and nursing note reviewed.  Constitutional:      General: She is not in acute distress.    Appearance: She is well-developed.  Cardiovascular:     Rate and Rhythm: Normal rate and regular rhythm.  Pulmonary:     Effort: Pulmonary effort is normal.     Breath sounds: Normal breath sounds.  Musculoskeletal:     Right lower leg: No edema.     Left lower leg: No edema.  Neurological:     Mental Status: She is  alert and oriented to person, place, and time.  Psychiatric:        Mood and Affect: Mood normal.        Behavior: Behavior normal.        Assessment & Plan:   History of COVID-19 Cough:   Stay well hydrated  Stay active  Deep breathing exercises  May take tylenol or fever or pain  May take mucinex DM twice daily  May start zyrtec     Follow up:  Follow up in 2 weeks or sooner if needed       Ivonne Andrew, NP 06/26/2020

## 2020-09-08 ENCOUNTER — Other Ambulatory Visit: Payer: Self-pay | Admitting: Internal Medicine

## 2020-09-08 DIAGNOSIS — M810 Age-related osteoporosis without current pathological fracture: Secondary | ICD-10-CM

## 2020-12-01 ENCOUNTER — Other Ambulatory Visit: Payer: Self-pay | Admitting: Internal Medicine

## 2020-12-01 DIAGNOSIS — M810 Age-related osteoporosis without current pathological fracture: Secondary | ICD-10-CM

## 2020-12-10 ENCOUNTER — Encounter: Payer: Self-pay | Admitting: Internal Medicine

## 2020-12-10 NOTE — Progress Notes (Signed)
Subjective:    Patient ID: [name], female    DOB: [birth year], 55 y.o.   MRN: [id number]  This visit occurred during the SARS-CoV-2 public health emergency.  Safety protocols were in place, including screening questions prior to the visit, additional usage of staff PPE, and extensive cleaning of exam room while observing appropriate contact time as indicated for disinfecting solutions.     HPI The patient is here for follow up of their chronic medical problems, including htn, OP  BP has been high - she can feel it. She does not check her BP at home.  She is taking her medication daily.   Not taking vitamin d or calcium. Not currently exercising.   Medications and allergies reviewed with patient and updated if appropriate.  Patient Active Problem List   Diagnosis Date Noted   History of COVID-19 06/26/2020   Age-related osteoporosis without current pathological fracture 07/02/2019   Essential hypertension 06/30/2019   Acute seborrheic dermatitis 06/30/2019   Vitamin D deficiency disease 06/30/2019   Systolic murmur 01/16/2015   Nephrolithiasis 11/22/2014   Routine general medical examination at a health care facility 12/02/2012   ALLERGIC RHINITIS 01/28/2008   GERD 01/28/2008    Current Outpatient Medications on File Prior to Visit  Medication Sig Dispense Refill   Cholecalciferol 125 MCG (5000 UT) capsule Take 1 capsule (5,000 Units total) by mouth daily. 90 capsule 1   desonide (DESOWEN) 0.05 % lotion Apply topically 2 (two) times daily. 118 mL 2   estradiol (ESTRACE) 0.5 MG tablet estradiol 0.5 mg tablet  TAKE 1 TABLET BY MOUTH EVERY DAY     fluticasone (FLONASE) 50 MCG/ACT nasal spray Place 1 spray into both nostrils every morning.      risedronate (ACTONEL) 35 MG tablet TAKE 1 TABLET BY MOUTH EVERY 7 DAYS. WITH WATER ON EMPTY STOMACH, NOTHING BY MOUTH OR LIE DOWN FOR NEXT 30 MINUTES. 12 tablet 0   No current facility-administered medications on file  prior to visit.    Past Medical History:  Diagnosis Date   Allergic rhinitis    Bilateral ureteral calculi    History of nephrolithiasis    Systolic murmur    PER PCP NOTE 10/ 2016--  asymptomatic   Wears glasses     Past Surgical History:  Procedure Laterality Date   ACHILLES TENDON LENGTHENING Bilateral age 47   CESAREAN SECTION  10-04-2005  &  03-20-2009   Bilateral Tubal Ligation w/ last one   CYSTOSCOPY WITH HOLMIUM LASER LITHOTRIPSY Bilateral 01/30/2015   Procedure: CYSTOSCOPY WITH HOLMIUM LASER LITHOTRIPSY;  Surgeon: Jethro Bolus, MD;  Location: [facility];  Service: Urology;  Laterality: Bilateral;   CYSTOSCOPY WITH RETROGRADE PYELOGRAM, URETEROSCOPY AND STENT PLACEMENT Bilateral 01/30/2015   Procedure: CYSTOSCOPY WITH RETROGRADE PYELOGRAM, URETEROSCOPY AND STENT PLACEMENT;  Surgeon: Jethro Bolus, MD;  Location: [facility];  Service: Urology;  Laterality: Bilateral;   STONE EXTRACTION WITH BASKET Bilateral 01/30/2015   Procedure: STONE EXTRACTION WITH BASKET;  Surgeon: Jethro Bolus, MD;  Location: [facility];  Service: Urology;  Laterality: Bilateral;    Social History   Socioeconomic History   Marital status: Married    Spouse name: Not on file   Number of children: Not on file   Years of education: Not on file   Highest education level: Not on file  Occupational History   Not on file  Tobacco Use   Smoking status: Never   Smokeless tobacco:  Never  Substance and Sexual Activity   Alcohol use: No   Drug use: No   Sexual activity: Not on file  Other Topics Concern   Not on file  Social History Narrative   Not on file   Social Determinants of Health   Financial Resource Strain: Not on file  Food Insecurity: Not on file  Transportation Needs: Not on file  Physical Activity: Not on file  Stress: Not on file  Social Connections: Not on file    Family History  Problem Relation Age of Onset    Breast cancer Mother    Hypertension Father    COPD Father    Diabetes Neg Hx    Early death Neg Hx    Heart disease Neg Hx    Hyperlipidemia Neg Hx    Stroke Neg Hx    Colon cancer Neg Hx    Alcohol abuse Neg Hx     Review of Systems  Constitutional:  Negative for chills and fever.  Respiratory:  Negative for cough, shortness of breath and wheezing.   Cardiovascular:  Negative for chest pain, palpitations and leg swelling.  Neurological:  Positive for light-headedness (occ - standing quickly). Negative for dizziness and headaches.      Objective:   Vitals:   12/11/20 1344  BP: (!) 142/90  Pulse: 84  Temp: 98 F (36.7 C)  SpO2: 99%   BP Readings from Last 3 Encounters:  12/11/20 (!) 142/90  06/26/20 (!) 90/59  06/30/19 (!) 150/90   Wt Readings from Last 3 Encounters:  12/11/20 97 lb (44 kg)  06/30/19 101 lb 4 oz (45.9 kg)  03/30/17 102 lb 6.4 oz (46.4 kg)   Body mass index is 17.18 kg/m.   Physical Exam    Constitutional: Appears well-developed and well-nourished. No distress.  HENT:  Head: Normocephalic and atraumatic.  Neck: Neck supple. No tracheal deviation present. No thyromegaly present.  No cervical lymphadenopathy Cardiovascular: Normal rate, regular rhythm and normal heart sounds.   No murmur heard. No carotid bruit .  No edema Pulmonary/Chest: Effort normal and breath sounds normal. No respiratory distress. No has no wheezes. No rales.  Skin: Skin is warm and dry. Not diaphoretic.  Psychiatric: Normal mood and affect. Behavior is normal.      Assessment & Plan:    Hypertension: Chronic BP not ideally controlled Advised monitoring bp at home - goal < 130/80 Continue avapro 150 mg daily Start amlodipine 2.5 mg daily cmp  Osteoporosis: Chronic Taking actonel 35 mg weekly - continue Not taking calcium or vitamin d - stressed importance of taking calcium and vitamin d daily - discussed different vitamins - she can try a plant calcium,  vitamin d, vit k, mag combo which she may tolerate better Ck vitamin d level Stressed regular exercise    Flu vaccine today

## 2020-12-10 NOTE — Patient Instructions (Addendum)
   Flu immunization administered today.     Blood work was ordered.     Medications changes include :   start amlodipine 2.5 mg daily  Your prescription(s) have been submitted to your pharmacy. Please take as directed and contact our office if you believe you are having problem(s) with the medication(s).   Monitor your BP at home.     Please followup in 6 months

## 2020-12-11 ENCOUNTER — Other Ambulatory Visit: Payer: Self-pay | Admitting: Internal Medicine

## 2020-12-11 ENCOUNTER — Other Ambulatory Visit: Payer: Self-pay

## 2020-12-11 ENCOUNTER — Ambulatory Visit: Payer: BC Managed Care – PPO | Admitting: Internal Medicine

## 2020-12-11 VITALS — BP 142/90 | HR 84 | Temp 98.0°F | Ht 63.0 in | Wt 97.0 lb

## 2020-12-11 DIAGNOSIS — M810 Age-related osteoporosis without current pathological fracture: Secondary | ICD-10-CM

## 2020-12-11 DIAGNOSIS — I10 Essential (primary) hypertension: Secondary | ICD-10-CM | POA: Diagnosis not present

## 2020-12-11 DIAGNOSIS — E559 Vitamin D deficiency, unspecified: Secondary | ICD-10-CM | POA: Diagnosis not present

## 2020-12-11 LAB — COMPREHENSIVE METABOLIC PANEL
ALT: 13 U/L (ref 0–35)
AST: 18 U/L (ref 0–37)
Albumin: 4.5 g/dL (ref 3.5–5.2)
Alkaline Phosphatase: 45 U/L (ref 39–117)
BUN: 9 mg/dL (ref 6–23)
CO2: 30 mEq/L (ref 19–32)
Calcium: 9.7 mg/dL (ref 8.4–10.5)
Chloride: 104 mEq/L (ref 96–112)
Creatinine, Ser: 0.63 mg/dL (ref 0.40–1.20)
GFR: 100.57 mL/min (ref >60.00)
Glucose, Bld: 88 mg/dL (ref 70–99)
Potassium: 4.5 mEq/L (ref 3.5–5.1)
Sodium: 140 mEq/L (ref 135–145)
Total Bilirubin: 0.6 mg/dL (ref 0.2–1.2)
Total Protein: 6.8 g/dL (ref 6.0–8.3)

## 2020-12-11 LAB — VITAMIN D 25 HYDROXY (VIT D DEFICIENCY, FRACTURES): VITD: 21.54 ng/mL — ABNORMAL LOW (ref 30.00–100.00)

## 2020-12-11 MED ORDER — AMLODIPINE BESYLATE 2.5 MG PO TABS: 2.5000 mg | ORAL_TABLET | Freq: Every day | ORAL | 1 refills | Status: DC

## 2020-12-11 MED ORDER — IRBESARTAN 150 MG PO TABS: 150.0000 mg | ORAL_TABLET | Freq: Every day | ORAL | 1 refills | Status: DC

## 2020-12-11 NOTE — Assessment & Plan Note (Signed)
Chronic BP well controlled Continue  cmp  

## 2021-03-07 ENCOUNTER — Other Ambulatory Visit: Payer: Self-pay | Admitting: Internal Medicine

## 2021-03-07 DIAGNOSIS — M810 Age-related osteoporosis without current pathological fracture: Secondary | ICD-10-CM

## 2021-04-15 ENCOUNTER — Encounter: Payer: Self-pay | Admitting: Internal Medicine

## 2021-04-15 NOTE — Progress Notes (Signed)
? ? ?Subjective:  ? ? Patient ID: [name], female    DOB: 1965/06/22, 56 y.o.   MRN: [id number] ? ?This visit occurred during the SARS-CoV-2 public health emergency.  Safety protocols were in place, including screening questions prior to the visit, additional usage of staff PPE, and extensive cleaning of exam room while observing appropriate contact time as indicated for disinfecting solutions. ? ? ? ?HPI ?Ryen is here for  ?Chief Complaint  ?Patient presents with  ? Irritable Bowel Syndrome  ?  IBS flair-up (wants to discuss medication)  ? Dizziness  ?  BP meds may be causing dizziness  ? ? ? ?BP meds - she is taking her medication daily - amlodipine and avapro.  Recently over the past 3 weeks has had dizzy spells since the beginning of Feb - more of a lightheadedness/dizziness - no spinning - like she is moving transiently. It can happen when she first stands or randomly.  Had a headache once when that occurred - not always.  She is concerned this is related to the medication or low blood pressure.  She does not monitor her blood pressure at home. ? ? ?IBS - she had some IBS symptoms and in the past has managed it with diet.  Had a bad episodes a couple of weeks ago that lasted about 4 days, but she still has some residual symptoms.  She has alternating constipation and diarrhea.  She thinks cheese is a trigger. She gets bad cramps, nausea at times in the morning.  She often has constipation and then diarrhea.  She has been taking a stool softener daily since then ? ? ? ?Medications and allergies reviewed with patient and updated if appropriate. ? ?Current Outpatient Medications on File Prior to Visit  ?Medication Sig Dispense Refill  ? amLODipine (NORVASC) 2.5 MG tablet Take 1 tablet (2.5 mg total) by mouth daily. 90 tablet 1  ? Cholecalciferol 125 MCG (5000 UT) capsule Take 1 capsule (5,000 Units total) by mouth daily. 90 capsule 1  ? desonide (DESOWEN) 0.05 % lotion Apply topically 2 (two) times  daily. 118 mL 2  ? estradiol (ESTRACE) 0.5 MG tablet estradiol 0.5 mg tablet ? TAKE 1 TABLET BY MOUTH EVERY DAY    ? fluticasone (FLONASE) 50 MCG/ACT nasal spray Place 1 spray into both nostrils every morning.     ? irbesartan (AVAPRO) 150 MG tablet Take 1 tablet (150 mg total) by mouth daily. 90 tablet 1  ? risedronate (ACTONEL) 35 MG tablet TAKE 1 TABLET BY MOUTH EVERY 7 DAYS. WITH WATER ON EMPTY STOMACH, NOTHING BY MOUTH OR LIE DOWN FOR NEXT 30 MINUTES. 12 tablet 0  ? ?No current facility-administered medications on file prior to visit.  ? ? ?Review of Systems  ?Constitutional:  Negative for fever.  ?Respiratory:  Negative for shortness of breath.   ?Cardiovascular:  Negative for chest pain and palpitations.  ?Gastrointestinal:  Positive for abdominal pain (cramping with IBS), anal bleeding (flare of hemorrhoid), constipation (with IBS), diarrhea (with IBS) and nausea (with IBS).  ?Neurological:  Positive for dizziness, light-headedness and headaches.  ? ?   ?Objective:  ? ?Vitals:  ? 04/16/21 1458  ?BP: 132/84  ?Pulse: 96  ?Temp: 98.2 ?F (36.8 ?C)  ?SpO2: 98%  ? ?BP Readings from Last 3 Encounters:  ?04/16/21 132/84  ?12/11/20 (!) 142/90  ?06/26/20 (!) 90/59  ? ?Wt Readings from Last 3 Encounters:  ?04/16/21 97 lb 12.8 oz (44.4 kg)  ?12/11/20 97 lb (44  kg)  ?06/30/19 101 lb 4 oz (45.9 kg)  ? ?Body mass index is 17.32 kg/m?. ? ?  ?Physical Exam ?Constitutional:   ?   General: She is not in acute distress. ?   Appearance: Normal appearance.  ?HENT:  ?   Head: Normocephalic and atraumatic.  ?Eyes:  ?   Conjunctiva/sclera: Conjunctivae normal.  ?Cardiovascular:  ?   Rate and Rhythm: Normal rate and regular rhythm.  ?   Heart sounds: Murmur (2/6) heard.  ?Pulmonary:  ?   Effort: Pulmonary effort is normal. No respiratory distress.  ?   Breath sounds: Normal breath sounds. No wheezing.  ?Abdominal:  ?   General: There is no distension.  ?   Palpations: Abdomen is soft.  ?   Tenderness: There is no abdominal  tenderness.  ?Musculoskeletal:  ?   Cervical back: Neck supple.  ?   Right lower leg: No edema.  ?   Left lower leg: No edema.  ?Lymphadenopathy:  ?   Cervical: No cervical adenopathy.  ?Skin: ?   Findings: No rash.  ?Neurological:  ?   Mental Status: She is alert. Mental status is at baseline.  ?Psychiatric:     ?   Mood and Affect: Mood normal.     ?   Behavior: Behavior normal.  ? ?   ? ? ?Cologuard negative 08/10/2019 ? ? ?Assessment & Plan:  ? ? ?High blood pressure: ?Chronic ?Blood pressure very well controlled here today ?Having symptoms of dizziness/lightheadedness that started 6-8 weeks ago ??  Related to amlodipine or hypotension ?Advised to buy blood pressure cuff and start monitoring her pressure at home ?Okay to hold amlodipine and see if her symptoms improve, but needs to monitor blood pressure and heart rate closely so we can adjust medication accordingly ? ?IBS: ?Chronic ?Has a history of IBS ?Typically involves alternating constipation and diarrhea and abdominal cramping ?Advise continuing the stool softener daily consider taking a fiber supplement on a daily basis ?We will start Hyos coming 0.125 mg sublingual every 4 hours as needed for abdominal cramping ?Can consider probiotics ?She knows cheese often causes some of her symptoms so she does try to avoid it ?If symptoms are not controlled can consider GI referral.  Has had Cologuard, but has never had a colonoscopy ? ? ? ? ?

## 2021-04-16 ENCOUNTER — Ambulatory Visit: Payer: BC Managed Care – PPO | Admitting: Internal Medicine

## 2021-04-16 VITALS — BP 132/84 | HR 96 | Temp 98.2°F | Ht 63.0 in | Wt 97.8 lb

## 2021-04-16 DIAGNOSIS — K582 Mixed irritable bowel syndrome: Secondary | ICD-10-CM | POA: Insufficient documentation

## 2021-04-16 DIAGNOSIS — I10 Essential (primary) hypertension: Secondary | ICD-10-CM

## 2021-04-16 MED ORDER — HYOSCYAMINE SULFATE 0.125 MG PO TBDP: 0.1250 mg | ORAL_TABLET | ORAL | 2 refills | Status: AC | PRN

## 2021-04-16 NOTE — Patient Instructions (Addendum)
? ? ? ? ? ?  Medications changes include :   hyoscyamine  pill for abdominal cramping as needed.  Hold amlodipine and monitor BP.   ? ? ?Continue stool softener or metamucil.   ? ? ?Your prescription(s) have been sent to your pharmacy.  ? ? ? ? ? ?

## 2021-06-05 ENCOUNTER — Other Ambulatory Visit: Payer: Self-pay | Admitting: Internal Medicine

## 2021-06-05 DIAGNOSIS — I10 Essential (primary) hypertension: Secondary | ICD-10-CM

## 2021-07-05 ENCOUNTER — Other Ambulatory Visit: Payer: Self-pay | Admitting: Obstetrics and Gynecology

## 2021-07-05 DIAGNOSIS — R928 Other abnormal and inconclusive findings on diagnostic imaging of breast: Secondary | ICD-10-CM

## 2021-07-09 ENCOUNTER — Other Ambulatory Visit: Payer: Self-pay | Admitting: Internal Medicine

## 2021-07-09 DIAGNOSIS — M810 Age-related osteoporosis without current pathological fracture: Secondary | ICD-10-CM

## 2021-07-16 ENCOUNTER — Ambulatory Visit
Admission: RE | Admit: 2021-07-16 | Discharge: 2021-07-16 | Disposition: A | Discharge status: Home / Self Care | Payer: BC Managed Care – PPO | Source: Ambulatory Visit | Attending: Obstetrics and Gynecology | Admitting: Obstetrics and Gynecology

## 2021-07-16 DIAGNOSIS — R928 Other abnormal and inconclusive findings on diagnostic imaging of breast: Secondary | ICD-10-CM

## 2021-07-25 LAB — HM MAMMOGRAPHY: HM Mammogram: NORMAL (ref 0–4)

## 2021-08-06 ENCOUNTER — Ambulatory Visit: Payer: BC Managed Care – PPO | Admitting: Internal Medicine

## 2021-08-06 ENCOUNTER — Encounter: Payer: Self-pay | Admitting: Internal Medicine

## 2021-08-06 VITALS — BP 112/66 | HR 64 | Temp 97.6°F | Resp 16 | Ht 63.0 in | Wt 103.0 lb

## 2021-08-06 DIAGNOSIS — Z1159 Encounter for screening for other viral diseases: Secondary | ICD-10-CM

## 2021-08-06 DIAGNOSIS — M810 Age-related osteoporosis without current pathological fracture: Secondary | ICD-10-CM

## 2021-08-06 DIAGNOSIS — Z23 Encounter for immunization: Secondary | ICD-10-CM

## 2021-08-06 DIAGNOSIS — I10 Essential (primary) hypertension: Secondary | ICD-10-CM

## 2021-08-06 DIAGNOSIS — E559 Vitamin D deficiency, unspecified: Secondary | ICD-10-CM | POA: Diagnosis not present

## 2021-08-06 DIAGNOSIS — Z0000 Encounter for general adult medical examination without abnormal findings: Secondary | ICD-10-CM

## 2021-08-06 LAB — BASIC METABOLIC PANEL
BUN: 23 mg/dL (ref 6–23)
CO2: 27 mEq/L (ref 19–32)
Calcium: 10.1 mg/dL (ref 8.4–10.5)
Chloride: 104 mEq/L (ref 96–112)
Creatinine, Ser: 0.86 mg/dL (ref 0.40–1.20)
GFR: 76.24 mL/min (ref >60.00)
Glucose, Bld: 95 mg/dL (ref 70–99)
Potassium: 4.6 mEq/L (ref 3.5–5.1)
Sodium: 139 mEq/L (ref 135–145)

## 2021-08-06 LAB — CBC WITH DIFFERENTIAL/PLATELET
Basophils Absolute: 0 10*3/uL (ref 0.0–0.1)
Basophils Relative: 0.5 % (ref 0.0–3.0)
Eosinophils Absolute: 0.1 10*3/uL (ref 0.0–0.7)
Eosinophils Relative: 1.3 % (ref 0.0–5.0)
HCT: 37.5 % (ref 36.0–46.0)
Hemoglobin: 12.4 g/dL (ref 12.0–15.0)
Lymphocytes Relative: 14.2 % (ref 12.0–46.0)
Lymphs Abs: 0.9 10*3/uL (ref 0.7–4.0)
MCHC: 33 g/dL (ref 30.0–36.0)
MCV: 89.3 fl (ref 78.0–100.0)
Monocytes Absolute: 0.4 10*3/uL (ref 0.1–1.0)
Monocytes Relative: 6.2 % (ref 3.0–12.0)
Neutro Abs: 5.2 10*3/uL (ref 1.4–7.7)
Neutrophils Relative %: 77.8 % — ABNORMAL HIGH (ref 43.0–77.0)
Platelets: 249 10*3/uL (ref 150.0–400.0)
RBC: 4.2 Mil/uL (ref 3.87–5.11)
RDW: 12.7 % (ref 11.5–15.5)
WBC: 6.6 10*3/uL (ref 4.0–10.5)

## 2021-08-06 LAB — LIPID PANEL
Cholesterol: 199 mg/dL (ref 0–200)
HDL: 94.7 mg/dL (ref >39.00)
LDL Cholesterol: 89 mg/dL (ref 0–99)
NonHDL: 103.98
Total CHOL/HDL Ratio: 2
Triglycerides: 73 mg/dL (ref 0.0–149.0)
VLDL: 14.6 mg/dL (ref 0.0–40.0)

## 2021-08-06 LAB — VITAMIN D 25 HYDROXY (VIT D DEFICIENCY, FRACTURES): VITD: 28.6 ng/mL — ABNORMAL LOW (ref 30.00–100.00)

## 2021-08-06 LAB — HEPATIC FUNCTION PANEL
ALT: 13 U/L (ref 0–35)
AST: 17 U/L (ref 0–37)
Albumin: 4.6 g/dL (ref 3.5–5.2)
Alkaline Phosphatase: 46 U/L (ref 39–117)
Bilirubin, Direct: 0.1 mg/dL (ref 0.0–0.3)
Total Bilirubin: 0.7 mg/dL (ref 0.2–1.2)
Total Protein: 7 g/dL (ref 6.0–8.3)

## 2021-08-06 MED ORDER — RISEDRONATE SODIUM 35 MG PO TABS
ORAL_TABLET | ORAL | 0 refills | Status: DC
Start: 2021-08-06 — End: 2021-08-26

## 2021-08-06 MED ORDER — CHOLECALCIFEROL 50 MCG (2000 UT) PO TABS: 1.0000 | ORAL_TABLET | Freq: Every day | ORAL | 1 refills | Status: DC

## 2021-08-06 NOTE — Progress Notes (Signed)
Subjective:  Patient ID: [name], female    DOB: [birth year]  Age: 56 y.o. MRN: [id number]  CC: Annual Exam and Hypertension   HPI Okie Jansson presents for a CPX and f/up -  She complains of a several month history of dizzy spells with lightheadedness and low blood pressure.  She denies chest pain, shortness of breath, or diaphoresis.  Outpatient Medications Prior to Visit  Medication Sig Dispense Refill   desonide (DESOWEN) 0.05 % lotion Apply topically 2 (two) times daily. 118 mL 2   estradiol (ESTRACE) 0.5 MG tablet estradiol 0.5 mg tablet  TAKE 1 TABLET BY MOUTH EVERY DAY     fluticasone (FLONASE) 50 MCG/ACT nasal spray Place 1 spray into both nostrils every morning.      hyoscyamine (ANASPAZ) 0.125 MG TBDP disintergrating tablet Place 1 tablet (0.125 mg total) under the tongue every 4 (four) hours as needed for cramping. 40 tablet 2   amLODipine (NORVASC) 2.5 MG tablet TAKE 1 TABLET BY MOUTH EVERY DAY 90 tablet 0   Cholecalciferol 125 MCG (5000 UT) capsule Take 1 capsule (5,000 Units total) by mouth daily. 90 capsule 1   irbesartan (AVAPRO) 150 MG tablet TAKE 1 TABLET BY MOUTH EVERY DAY 90 tablet 0   risedronate (ACTONEL) 35 MG tablet TAKE 1 TABLET BY MOUTH EVERY 7 DAYS. WITH WATER ON EMPTY STOMACH, NOTHING BY MOUTH OR LIE DOWN FOR NEXT 30 MINUTES. 12 tablet 0   No facility-administered medications prior to visit.    ROS Review of Systems  Constitutional: Negative.  Negative for diaphoresis and fatigue.  HENT: Negative.    Eyes: Negative.   Respiratory:  Negative for cough, chest tightness, shortness of breath and wheezing.   Cardiovascular:  Negative for chest pain, palpitations and leg swelling.  Gastrointestinal:  Negative for abdominal pain, diarrhea, nausea and vomiting.  Endocrine: Negative.  Negative for polyuria.  Genitourinary: Negative.  Negative for difficulty urinating and frequency.  Musculoskeletal: Negative.   Skin: Negative.    Neurological:  Positive for dizziness and light-headedness. Negative for weakness and numbness.  Hematological:  Negative for adenopathy. Does not bruise/bleed easily.  Psychiatric/Behavioral: Negative.      Objective:  BP 112/66 (BP Location: Left Arm, Patient Position: Sitting, Cuff Size: Large)   Pulse 64   Temp 97.6 F (36.4 C) (Oral)   Resp 16   Ht 5\' 3"  (1.6 m)   Wt 103 lb (46.7 kg)   LMP 12/26/2014 (Approximate)   SpO2 99%   BMI 18.25 kg/m   BP Readings from Last 3 Encounters:  08/06/21 112/66  04/16/21 132/84  12/11/20 (!) 142/90    Wt Readings from Last 3 Encounters:  08/06/21 103 lb (46.7 kg)  04/16/21 97 lb 12.8 oz (44.4 kg)  12/11/20 97 lb (44 kg)    Physical Exam HENT:     Nose: Nose normal.     Mouth/Throat:     Mouth: Mucous membranes are moist.  Eyes:     General: No scleral icterus.    Conjunctiva/sclera: Conjunctivae normal.  Cardiovascular:     Heart sounds: Murmur heard.     Systolic murmur is present with a grade of 1/6.     No diastolic murmur is present.     No friction rub. No gallop.  Pulmonary:     Breath sounds: No stridor. No wheezing, rhonchi or rales.  Abdominal:     General: Abdomen is flat.     Palpations: There is no mass.  Tenderness: There is no abdominal tenderness. There is no guarding or rebound.     Hernia: No hernia is present.  Musculoskeletal:     Cervical back: Neck supple.     Right lower leg: No edema.     Left lower leg: No edema.  Skin:    General: Skin is warm and dry.  Neurological:     General: No focal deficit present.     Mental Status: She is alert. Mental status is at baseline.  Psychiatric:        Mood and Affect: Mood normal.        Behavior: Behavior normal.     Lab Results  Component Value Date   WBC 6.6 08/06/2021   HGB 12.4 08/06/2021   HCT 37.5 08/06/2021   PLT 249.0 08/06/2021   GLUCOSE 95 08/06/2021   CHOL 199 08/06/2021   TRIG 73.0 08/06/2021   HDL 94.70 08/06/2021   LDLCALC  89 08/06/2021   ALT 13 08/06/2021   AST 17 08/06/2021   NA 139 08/06/2021   K 4.6 08/06/2021   CL 104 08/06/2021   CREATININE 0.86 08/06/2021   BUN 23 08/06/2021   CO2 27 08/06/2021   TSH 0.71 06/30/2019    US BREAST LTD UNI LEFT INC AXILLA  Result Date: 07/16/2021 CLINICAL DATA:  56 year old female recalled from screening mammogram dated 07/03/2021 for a possible left breast mass. EXAM: ULTRASOUND OF THE LEFT BREAST COMPARISON:  Prior studies. FINDINGS: Targeted ultrasound is performed, showing 2 adjacent oval, circumscribed anechoic cysts at the 7 o'clock position 2 cm from the nipple. In total the measure 9 x 7 x 6 mm. There is no associated vascularity. This correlates well with the mammographic finding. IMPRESSION: Benign fibrocystic changes corresponding with the screening mammographic findings. No further follow-up required. RECOMMENDATION: Screening mammogram in one year.(Code:SM-B-01Y) I have discussed the findings and recommendations with the patient. If applicable, a reminder letter will be sent to the patient regarding the next appointment. BI-RADS CATEGORY  2: Benign. Electronically Signed   By: Kristopher Oppenheim M.D.   On: 07/16/2021 13:02   Assessment & Plan:   Abernathy was seen today for annual exam and hypertension.  Diagnoses and all orders for this visit:  Vitamin D deficiency disease -     VITAMIN D 25 Hydroxy (Vit-D Deficiency, Fractures); Future -     DG Bone Density; Future -     VITAMIN D 25 Hydroxy (Vit-D Deficiency, Fractures) -     Cholecalciferol 50 MCG (2000 UT) TABS; Take 1 tablet (2,000 Units total) by mouth daily.  Routine general medical examination at a health care facility- Exam completed, labs reviewed, vaccines reviewed and updated, cancer screenings are up-to-date, patient education was given. -     Lipid panel; Future -     Hepatitis C antibody; Future -     HIV Antibody (routine testing w rflx); Future -     HIV Antibody (routine testing w rflx) -      Hepatitis C antibody -     Lipid panel  Essential hypertension- Her blood pressure is overcontrolled and she is symptomatic.  Will discontinue her antihypertensives. -     CBC with Differential/Platelet; Future -     Basic metabolic panel; Future -     Hepatic function panel; Future -     Hepatic function panel -     Basic metabolic panel -     CBC with Differential/Platelet  Age-related osteoporosis without current pathological fracture -  VITAMIN D 25 Hydroxy (Vit-D Deficiency, Fractures); Future -     Basic metabolic panel; Future -     Hepatic function panel; Future -     risedronate (ACTONEL) 35 MG tablet; TAKE 1 TABLET BY MOUTH EVERY 7 DAYS. WITH WATER ON EMPTY STOMACH, NOTHING BY MOUTH OR LIE DOWN FOR NEXT 30 MINUTES. -     DG Bone Density; Future -     Hepatic function panel -     Basic metabolic panel -     VITAMIN D 25 Hydroxy (Vit-D Deficiency, Fractures) -     Cholecalciferol 50 MCG (2000 UT) TABS; Take 1 tablet (2,000 Units total) by mouth daily.  Need for hepatitis C screening test -     Hepatitis C antibody; Future -     Hepatitis C antibody  Other orders -     Varicella-zoster vaccine IM (Shingrix)   I have discontinued Hermie Reagor. Bevans "Cindy"'s Cholecalciferol, amLODipine, and irbesartan. I am also having her start on Cholecalciferol. Additionally, I am having her maintain her fluticasone, estradiol, desonide, hyoscyamine, and risedronate.  Meds ordered this encounter  Medications   risedronate (ACTONEL) 35 MG tablet    Sig: TAKE 1 TABLET BY MOUTH EVERY 7 DAYS. WITH WATER ON EMPTY STOMACH, NOTHING BY MOUTH OR LIE DOWN FOR NEXT 30 MINUTES.    Dispense:  4 tablet    Refill:  0    DX Code Needed  .   Cholecalciferol 50 MCG (2000 UT) TABS    Sig: Take 1 tablet (2,000 Units total) by mouth daily.    Dispense:  90 tablet    Refill:  1     Follow-up: Return in about 6 months (around 02/06/2022).  Sanda Linger, MD

## 2021-08-06 NOTE — Patient Instructions (Signed)

## 2021-08-07 LAB — HIV ANTIBODY (ROUTINE TESTING W REFLEX): HIV 1&2 Ab, 4th Generation: NONREACTIVE

## 2021-08-07 LAB — HEPATITIS C ANTIBODY: Hepatitis C Ab: NONREACTIVE

## 2021-08-26 ENCOUNTER — Other Ambulatory Visit: Payer: Self-pay | Admitting: Internal Medicine

## 2021-08-26 DIAGNOSIS — M810 Age-related osteoporosis without current pathological fracture: Secondary | ICD-10-CM

## 2021-09-01 ENCOUNTER — Other Ambulatory Visit: Payer: Self-pay | Admitting: Internal Medicine

## 2021-09-01 DIAGNOSIS — I10 Essential (primary) hypertension: Secondary | ICD-10-CM

## 2021-09-25 ENCOUNTER — Other Ambulatory Visit: Payer: Self-pay | Admitting: Internal Medicine

## 2021-09-25 DIAGNOSIS — M810 Age-related osteoporosis without current pathological fracture: Secondary | ICD-10-CM

## 2021-10-19 IMAGING — MG MM DIGITAL DIAGNOSTIC UNILAT*L* W/ TOMO W/ CAD
8 series · 8 of 24 positions shown · non-contrast
Comparison: Previous exam(s).

CLINICAL DATA: 54-year-old female presenting for evaluation of a
tender palpable lump in the left breast. The patient is on estrogen
replacement therapy, and has history of breast cancer in her mother.

EXAM:
DIGITAL DIAGNOSTIC LEFT MAMMOGRAM WITH CAD AND TOMO
ULTRASOUND LEFT BREAST

[L XCCL synth-2D]
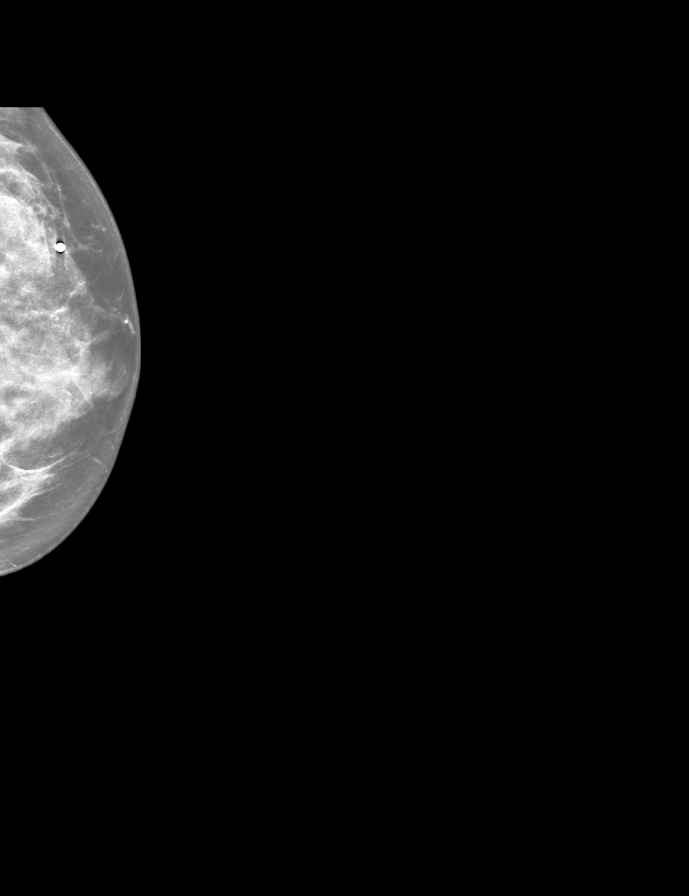

[L MLO synth-2D]
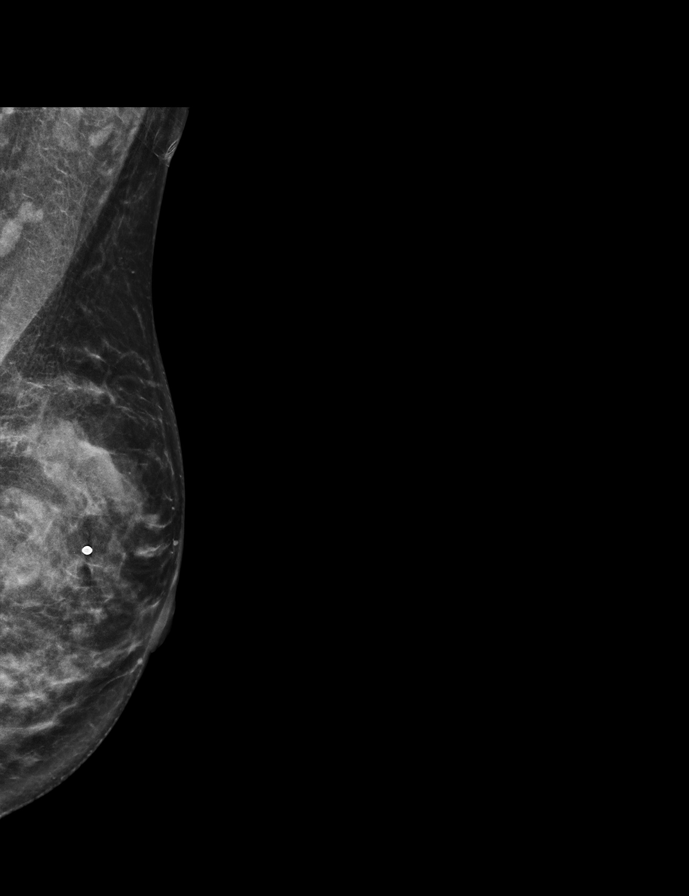

[L CC synth-2D]
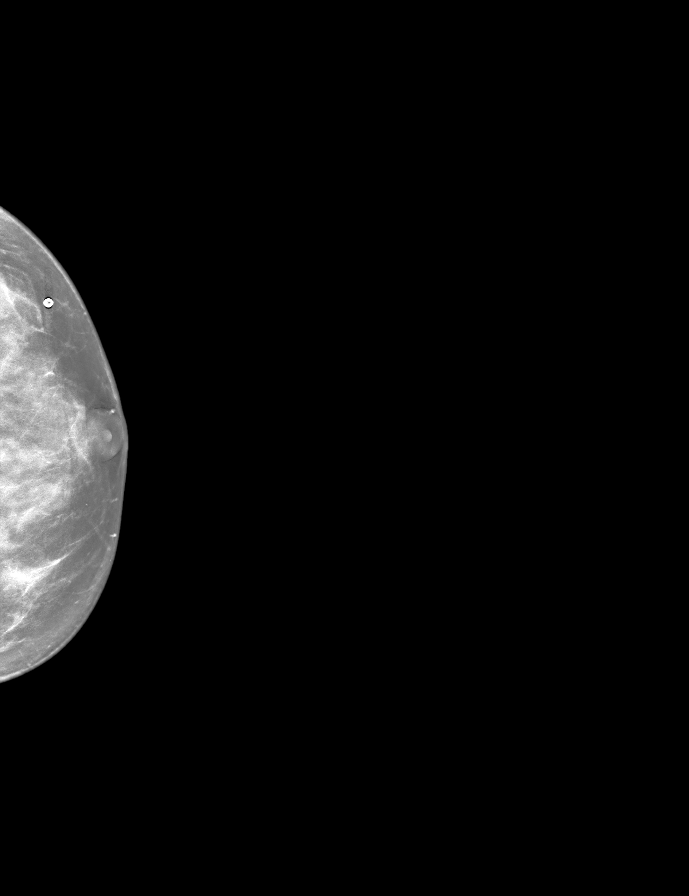

[L TAN synth-2D]
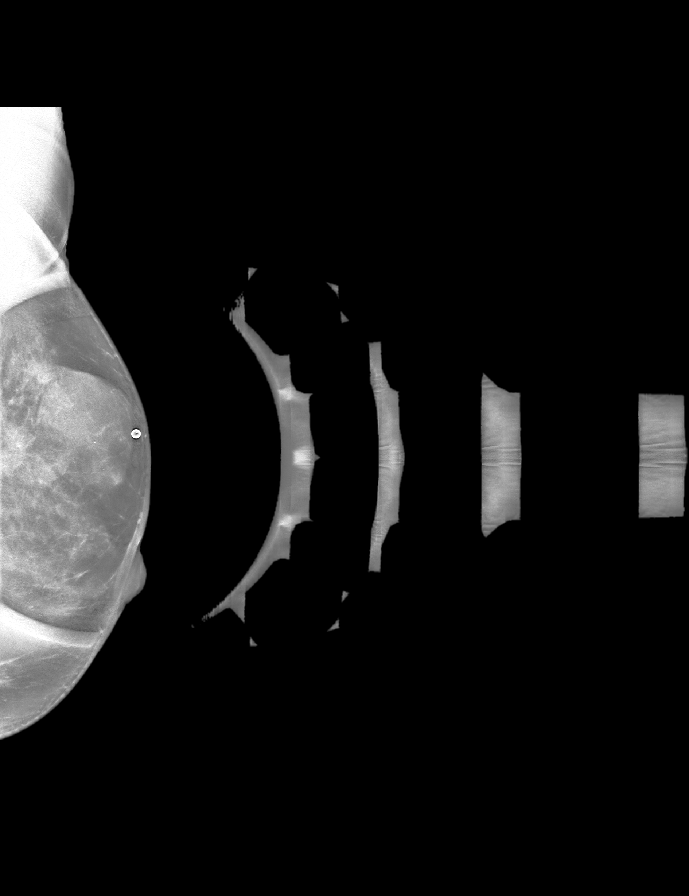

[L MLO tomo · tomo slice 28/55.0]
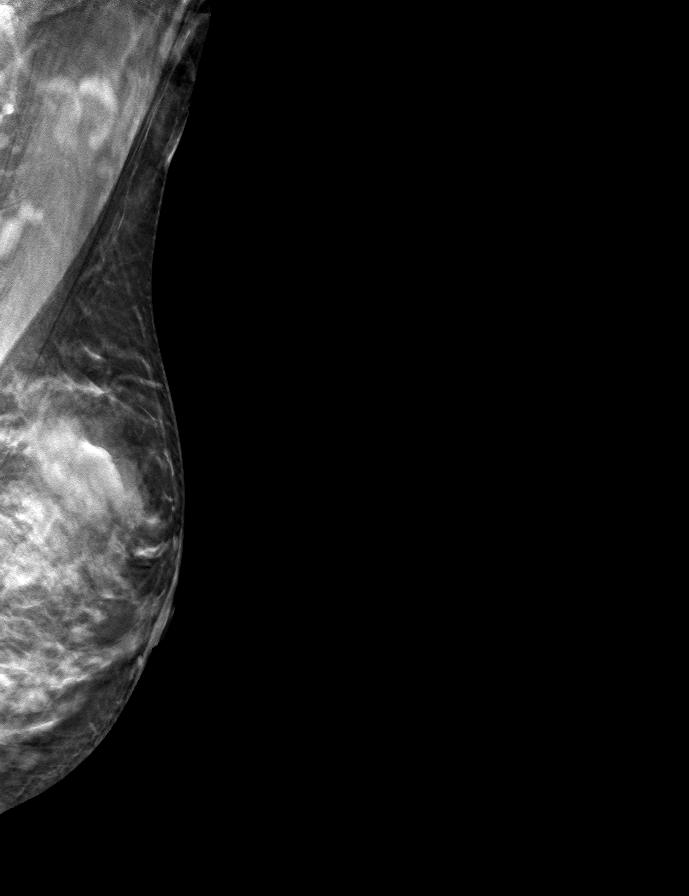

[L XCCL tomo · tomo slice 29/56.0]
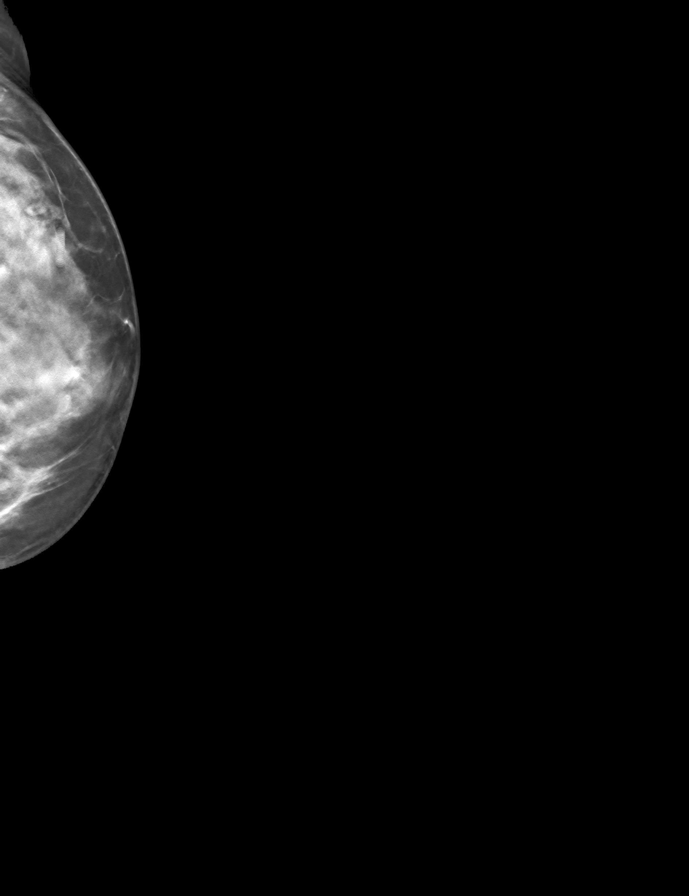

[L TAN tomo · tomo slice 28/55.0]
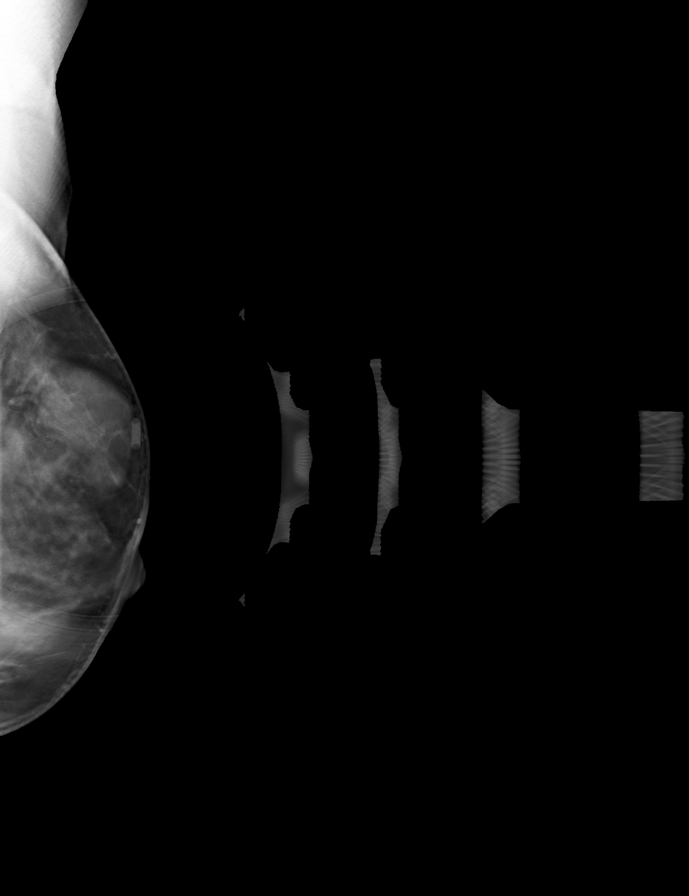

[L CC tomo · tomo slice 27/54.0]
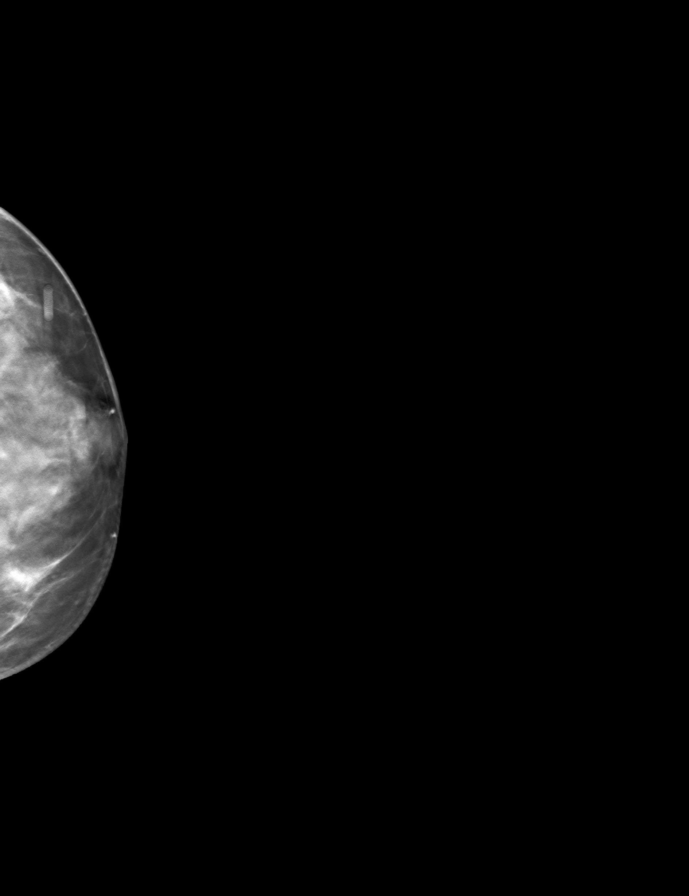

[8 of 24 positions shown; findings below may reference images not displayed]

ACR Breast Density Category d: The breast tissue is extremely dense,
which lowers the sensitivity of mammography.
FINDINGS: Spot compression tomosynthesis images of the palpable site
demonstrates and oval circumscribed mass measuring 3 cm. There may
also be an obscured mass in the superior left breast as well.

Mammographic images were processed with CAD.

On physical exam, there is a mobile firm palpable lump in the
lateral aspect of the left breast.

Ultrasound targeted to the left breast at 3 o'clock, 2 cm from the
nipple demonstrates an anechoic oval circumscribed mass measuring
3.0 x 1.7 x 2.6 cm. No blood flow is seen within the mass on color
Doppler imaging. There is a smaller adjacent 0.9 cm cyst.

Ultrasound targeted to the left breast at 12 o'clock, 2 cm from the
nipple demonstrates a smaller septated anechoic oval cyst
corresponding with the obscured mass on mammography measuring 1.6 x
0.8 x 1.0 cm.
IMPRESSION: 1. The palpable lump in the lateral left breast corresponds with a
benign cyst. Other benign cysts are seen scattered through the upper
outer quadrant.

RECOMMENDATION:
Aspiration was offered but declined by the patient at this time. She
may return in the future if the cyst becomes painful and she desires
symptomatic relief. Otherwise, bilateral screening mammogram is due
in Tuesday January, 2020.

I have discussed the findings and recommendations with the patient.
If applicable, a reminder letter will be sent to the patient
regarding the next appointment.

BI-RADS CATEGORY  2: Benign.

## 2021-11-22 ENCOUNTER — Ambulatory Visit: Payer: BC Managed Care – PPO

## 2021-12-05 ENCOUNTER — Ambulatory Visit (INDEPENDENT_AMBULATORY_CARE_PROVIDER_SITE_OTHER): Payer: BC Managed Care – PPO

## 2021-12-05 DIAGNOSIS — Z23 Encounter for immunization: Secondary | ICD-10-CM | POA: Diagnosis not present

## 2021-12-26 ENCOUNTER — Other Ambulatory Visit: Payer: Self-pay | Admitting: Internal Medicine

## 2021-12-26 DIAGNOSIS — M810 Age-related osteoporosis without current pathological fracture: Secondary | ICD-10-CM

## 2022-03-20 ENCOUNTER — Other Ambulatory Visit: Payer: Self-pay | Admitting: Internal Medicine

## 2022-03-20 DIAGNOSIS — M810 Age-related osteoporosis without current pathological fracture: Secondary | ICD-10-CM

## 2022-04-04 ENCOUNTER — Other Ambulatory Visit: Payer: Self-pay | Admitting: Internal Medicine

## 2022-04-04 DIAGNOSIS — E559 Vitamin D deficiency, unspecified: Secondary | ICD-10-CM

## 2022-04-04 DIAGNOSIS — M810 Age-related osteoporosis without current pathological fracture: Secondary | ICD-10-CM

## 2022-06-10 ENCOUNTER — Other Ambulatory Visit: Payer: Self-pay | Admitting: Internal Medicine

## 2022-06-10 DIAGNOSIS — M810 Age-related osteoporosis without current pathological fracture: Secondary | ICD-10-CM

## 2022-08-20 ENCOUNTER — Encounter (INDEPENDENT_AMBULATORY_CARE_PROVIDER_SITE_OTHER): Payer: Self-pay

## 2022-09-03 ENCOUNTER — Ambulatory Visit (INDEPENDENT_AMBULATORY_CARE_PROVIDER_SITE_OTHER): Payer: BC Managed Care – PPO | Admitting: Internal Medicine

## 2022-09-03 ENCOUNTER — Encounter: Payer: Self-pay | Admitting: Internal Medicine

## 2022-09-03 VITALS — BP 138/78 | HR 86 | Temp 97.9°F | Resp 16 | Ht 63.0 in | Wt 106.0 lb

## 2022-09-03 DIAGNOSIS — E559 Vitamin D deficiency, unspecified: Secondary | ICD-10-CM

## 2022-09-03 DIAGNOSIS — Z0001 Encounter for general adult medical examination with abnormal findings: Secondary | ICD-10-CM | POA: Diagnosis not present

## 2022-09-03 DIAGNOSIS — Z1211 Encounter for screening for malignant neoplasm of colon: Secondary | ICD-10-CM

## 2022-09-03 DIAGNOSIS — R011 Cardiac murmur, unspecified: Secondary | ICD-10-CM

## 2022-09-03 DIAGNOSIS — I10 Essential (primary) hypertension: Secondary | ICD-10-CM | POA: Diagnosis not present

## 2022-09-03 DIAGNOSIS — M810 Age-related osteoporosis without current pathological fracture: Secondary | ICD-10-CM

## 2022-09-03 LAB — VITAMIN D 25 HYDROXY (VIT D DEFICIENCY, FRACTURES): VITD: 26.92 ng/mL — ABNORMAL LOW (ref 30.00–100.00)

## 2022-09-03 LAB — CBC WITH DIFFERENTIAL/PLATELET
Basophils Absolute: 0 10*3/uL (ref 0.0–0.1)
Basophils Relative: 0.4 % (ref 0.0–3.0)
Eosinophils Absolute: 0.2 10*3/uL (ref 0.0–0.7)
Eosinophils Relative: 3.2 % (ref 0.0–5.0)
HCT: 39 % (ref 36.0–46.0)
Hemoglobin: 12.5 g/dL (ref 12.0–15.0)
Lymphocytes Relative: 31.7 % (ref 12.0–46.0)
Lymphs Abs: 1.6 10*3/uL (ref 0.7–4.0)
MCHC: 32.1 g/dL (ref 30.0–36.0)
MCV: 88.5 fl (ref 78.0–100.0)
Monocytes Absolute: 0.4 10*3/uL (ref 0.1–1.0)
Monocytes Relative: 8 % (ref 3.0–12.0)
Neutro Abs: 2.9 10*3/uL (ref 1.4–7.7)
Neutrophils Relative %: 56.7 % (ref 43.0–77.0)
Platelets: 321 10*3/uL (ref 150.0–400.0)
RBC: 4.41 Mil/uL (ref 3.87–5.11)
RDW: 13 % (ref 11.5–15.5)
WBC: 5.2 10*3/uL (ref 4.0–10.5)

## 2022-09-03 LAB — URINALYSIS, ROUTINE W REFLEX MICROSCOPIC
Bilirubin Urine: NEGATIVE
Ketones, ur: NEGATIVE
Leukocytes,Ua: NEGATIVE
Nitrite: NEGATIVE
Specific Gravity, Urine: 1.02 (ref 1.000–1.030)
Total Protein, Urine: NEGATIVE
Urine Glucose: NEGATIVE
Urobilinogen, UA: 2 — AB (ref 0.0–1.0)
pH: 6.5 (ref 5.0–8.0)

## 2022-09-03 LAB — HEPATIC FUNCTION PANEL
ALT: 27 U/L (ref 0–35)
AST: 22 U/L (ref 0–37)
Albumin: 4.3 g/dL (ref 3.5–5.2)
Alkaline Phosphatase: 60 U/L (ref 39–117)
Bilirubin, Direct: 0.1 mg/dL (ref 0.0–0.3)
Total Bilirubin: 0.4 mg/dL (ref 0.2–1.2)
Total Protein: 6.9 g/dL (ref 6.0–8.3)

## 2022-09-03 LAB — TSH: TSH: 0.68 u[IU]/mL (ref 0.35–5.50)

## 2022-09-03 LAB — PHOSPHORUS: Phosphorus: 4.3 mg/dL (ref 2.3–4.6)

## 2022-09-03 LAB — BASIC METABOLIC PANEL
BUN: 15 mg/dL (ref 6–23)
CO2: 30 mEq/L (ref 19–32)
Calcium: 9.9 mg/dL (ref 8.4–10.5)
Chloride: 101 mEq/L (ref 96–112)
Creatinine, Ser: 0.63 mg/dL (ref 0.40–1.20)
GFR: 99.36 mL/min (ref >60.00)
Glucose, Bld: 100 mg/dL — ABNORMAL HIGH (ref 70–99)
Potassium: 4 mEq/L (ref 3.5–5.1)
Sodium: 138 mEq/L (ref 135–145)

## 2022-09-03 MED ORDER — VITAMIN D3 50 MCG (2000 UT) PO CAPS
4000.0000 [IU] | ORAL_CAPSULE | Freq: Every day | ORAL | 1 refills | Status: AC
Start: 2022-09-03 — End: ?

## 2022-09-03 MED ORDER — OLMESARTAN MEDOXOMIL 5 MG PO TABS: 5.0000 mg | ORAL_TABLET | Freq: Every day | ORAL | 1 refills | Status: AC

## 2022-09-03 NOTE — Patient Instructions (Signed)

## 2022-09-03 NOTE — Progress Notes (Signed)
Subjective:  Patient ID: [name], female    DOB: 1965/12/12  Age: 57 y.o. MRN: [id number]  CC: Annual Exam and Hypertension   HPI [name] presents for a CPX and f/up -----  Discussed the use of AI scribe software for clinical note transcription with the patient, who gave verbal consent to proceed.  History of Present Illness   The patient, with a history of hypertension, recently recovered from COVID-19. They experienced symptoms including fever and cough with phlegm, which have since resolved. They report residual congestion. The patient was previously on antihypertensive medication but discontinued due to episodes of dizziness, which they attributed to low blood pressure. They have not experienced symptoms suggestive of hypertension since discontinuation of the medication.  The patient remains active and denies chest pain, shortness of breath, and swelling. They are due for a Cologuard test and bone density scan, both last performed three years ago. They are currently taking a vitamin D supplement and had a mammogram a month ago with no concerning findings.  The patient reports constipation, which they manage with occasional use of a stool softener. They deny blood in stool and abdominal pain. They are on Actonel and deny any side effects such as painful or difficult swallowing. They have maintained a stable weight of 106 pounds, which is slightly higher than their usual weight range.  The patient is also on estradiol and another unspecified medication, which they attempted to discontinue but resumed due to side effects. They have a family history of hypertension and atrial fibrillation but no known family history of heart disease. The patient has a lifelong heart murmur.       Outpatient Medications Prior to Visit  Medication Sig Dispense Refill   desonide (DESOWEN) 0.05 % lotion Apply topically 2 (two) times daily. 118 mL 2   estradiol (ESTRACE) 0.5 MG tablet  estradiol 0.5 mg tablet  TAKE 1 TABLET BY MOUTH EVERY DAY     fluticasone (FLONASE) 50 MCG/ACT nasal spray Place 1 spray into both nostrils every morning.      hyoscyamine (ANASPAZ) 0.125 MG TBDP disintergrating tablet Place 1 tablet (0.125 mg total) under the tongue every 4 (four) hours as needed for cramping. 40 tablet 2   risedronate (ACTONEL) 35 MG tablet TAKE 1 TABLET EVERY 7 DAYS WITH WATER ON EMPTY STOMACH DONT LIE DOWN FOR 30 MIN 12 tablet 0   VITAMIN D3 50 MCG (2000 UT) capsule TAKE 1 CAPSULE BY MOUTH EVERY DAY 100 capsule 1   No facility-administered medications prior to visit.    ROS Review of Systems  Constitutional: Negative.  Negative for diaphoresis and fatigue.  HENT: Negative.    Eyes:  Negative for visual disturbance.  Respiratory:  Negative for cough, chest tightness, shortness of breath and wheezing.   Cardiovascular:  Negative for chest pain, palpitations and leg swelling.  Gastrointestinal:  Positive for constipation. Negative for abdominal pain, blood in stool and nausea.  Genitourinary: Negative.  Negative for difficulty urinating, dysuria and hematuria.  Musculoskeletal: Negative.   Skin: Negative.   Neurological: Negative.  Negative for dizziness, weakness, light-headedness and headaches.  Hematological:  Negative for adenopathy. Does not bruise/bleed easily.  Psychiatric/Behavioral: Negative.      Objective:  BP 138/78 (BP Location: Right Arm, Patient Position: Sitting, Cuff Size: Normal)   Pulse 86   Temp 97.9 F (36.6 C) (Oral)   Resp 16   Ht 5\' 3"  (1.6 m)   Wt 106 lb (48.1 kg)  LMP 12/26/2014 (Approximate)   SpO2 95%   BMI 18.78 kg/m   BP Readings from Last 3 Encounters:  09/03/22 138/78  08/06/21 112/66  04/16/21 132/84    Wt Readings from Last 3 Encounters:  09/03/22 106 lb (48.1 kg)  08/06/21 103 lb (46.7 kg)  04/16/21 97 lb 12.8 oz (44.4 kg)    Physical Exam Vitals reviewed.  Constitutional:      Appearance: Normal appearance.   HENT:     Mouth/Throat:     Mouth: Mucous membranes are moist.  Eyes:     General: No scleral icterus.    Conjunctiva/sclera: Conjunctivae normal.  Cardiovascular:     Rate and Rhythm: Normal rate and regular rhythm.     Heart sounds: S1 normal and S2 normal. Murmur heard.     Systolic murmur is present with a grade of 2/6.     No diastolic murmur is present.     No friction rub. No gallop.     Comments: EKG- NSR, 89 bpm ?LAE No LVH, Q waves, or ST/T waves  Pulmonary:     Effort: Pulmonary effort is normal.     Breath sounds: No stridor. No wheezing, rhonchi or rales.  Abdominal:     General: Abdomen is flat.     Palpations: There is no mass.     Tenderness: There is no abdominal tenderness. There is no guarding.     Hernia: No hernia is present.  Musculoskeletal:     Cervical back: Neck supple.     Right lower leg: No edema.     Left lower leg: No edema.  Skin:    General: Skin is warm and dry.     Findings: No rash.  Neurological:     General: No focal deficit present.     Mental Status: She is alert. Mental status is at baseline.  Psychiatric:        Mood and Affect: Mood normal.        Behavior: Behavior normal.     Lab Results  Component Value Date   WBC 5.2 09/03/2022   HGB 12.5 09/03/2022   HCT 39.0 09/03/2022   PLT 321.0 09/03/2022   GLUCOSE 100 (H) 09/03/2022   CHOL 199 08/06/2021   TRIG 73.0 08/06/2021   HDL 94.70 08/06/2021   LDLCALC 89 08/06/2021   ALT 27 09/03/2022   AST 22 09/03/2022   NA 138 09/03/2022   K 4.0 09/03/2022   CL 101 09/03/2022   CREATININE 0.63 09/03/2022   BUN 15 09/03/2022   CO2 30 09/03/2022   TSH 0.68 09/03/2022    US BREAST LTD UNI LEFT INC AXILLA  Result Date: 07/16/2021 CLINICAL DATA:  57 year old female recalled from screening mammogram dated 07/03/2021 for a possible left breast mass. EXAM: ULTRASOUND OF THE LEFT BREAST COMPARISON:  Prior studies. FINDINGS: Targeted ultrasound is performed, showing 2 adjacent  oval, circumscribed anechoic cysts at the 7 o'clock position 2 cm from the nipple. In total the measure 9 x 7 x 6 mm. There is no associated vascularity. This correlates well with the mammographic finding. IMPRESSION: Benign fibrocystic changes corresponding with the screening mammographic findings. No further follow-up required. RECOMMENDATION: Screening mammogram in one year.(Code:SM-B-01Y) I have discussed the findings and recommendations with the patient. If applicable, a reminder letter will be sent to the patient regarding the next appointment. BI-RADS CATEGORY  2: Benign. Electronically Signed   By: Sande Brothers M.D.   On: 07/16/2021 13:02   Assessment & Plan:  Vitamin D deficiency disease -     VITAMIN D 25 Hydroxy (Vit-D Deficiency, Fractures); Future -     DG Bone Density; Future -     Vitamin D3; Take 2 capsules (4,000 Units total) by mouth daily.  Dispense: 200 capsule; Refill: 1  Essential hypertension- Her blood pressure is not at the goal of 130/80.  There is no LVH, Will treat with an ARB. -     Urinalysis, Routine w reflex microscopic; Future -     TSH; Future -     Hepatic function panel; Future -     CBC with Differential/Platelet; Future -     Basic metabolic panel; Future -     EKG 12-Lead -     Olmesartan Medoxomil; Take 1 tablet (5 mg total) by mouth daily.  Dispense: 90 tablet; Refill: 1  Age-related osteoporosis without current pathological fracture- She is overdue for a DEXA scan. -     Phosphorus; Future -     VITAMIN D 25 Hydroxy (Vit-D Deficiency, Fractures); Future -     Hepatic function panel; Future -     Basic metabolic panel; Future -     DG Bone Density; Future -     Vitamin D3; Take 2 capsules (4,000 Units total) by mouth daily.  Dispense: 200 capsule; Refill: 1  Encounter for general adult medical examination with abnormal findings- Exam completed, labs reviewed - statin is not indicated, vaccines reviewed and updated, cancer screenings addressed, pt  ed material was given.   Systolic murmur -     EKG 12-Lead  Screening for colon cancer -     Cologuard     Follow-up: Return in about 6 months (around 03/06/2023).  Sanda Linger, MD

## 2022-09-06 ENCOUNTER — Other Ambulatory Visit: Payer: Self-pay | Admitting: Internal Medicine

## 2022-09-06 DIAGNOSIS — M810 Age-related osteoporosis without current pathological fracture: Secondary | ICD-10-CM

## 2022-09-07 ENCOUNTER — Encounter: Payer: Self-pay | Admitting: Internal Medicine

## 2022-09-08 MED ORDER — RISEDRONATE SODIUM 35 MG PO TABS
ORAL_TABLET | ORAL | 1 refills | Status: DC
Start: 2022-09-08 — End: 2023-02-07

## 2022-09-15 ENCOUNTER — Encounter: Payer: Self-pay | Admitting: Family Medicine

## 2022-09-15 ENCOUNTER — Ambulatory Visit: Payer: BC Managed Care – PPO | Admitting: Family Medicine

## 2022-09-15 VITALS — BP 118/80 | HR 72 | Temp 98.4°F | Resp 20 | Ht 63.0 in | Wt 105.0 lb

## 2022-09-15 DIAGNOSIS — J0110 Acute frontal sinusitis, unspecified: Secondary | ICD-10-CM

## 2022-09-15 MED ORDER — DOXYCYCLINE HYCLATE 100 MG PO TABS
100.0000 mg | ORAL_TABLET | Freq: Two times a day (BID) | ORAL | 0 refills | Status: AC
Start: 2022-09-15 — End: 2022-09-22

## 2022-09-15 NOTE — Patient Instructions (Signed)
Start antihistamine - Claritin, Zyrtec, Allegra, or Xyzal

## 2022-09-15 NOTE — Progress Notes (Unsigned)
Assessment & Plan:  1. Acute non-recurrent frontal sinusitis Education provided on sinus infections. Discussed symptom management. Encouraged to continue Flonase and start an antihistamine daily.  - doxycycline (VIBRA-TABS) 100 MG tablet; Take 1 tablet (100 mg total) by mouth 2 (two) times daily for 7 days.  Dispense: 14 tablet; Refill: 0   Follow up plan: Return if symptoms worsen or fail to improve.  [name], MSN, APRN, FNP-C  Subjective:  HPI: [name] is a 57 y.o. female presenting on 09/15/2022 for Cough (Continued cough ( 3 weeks post-COVID) [name] has ear pressure bilateral and some neck pain that extends down into left armpit )  Patient reports having COVID three weeks ago. Her symptoms resolved with the exception of her cough. Five days ago she started experiencing increasing pressure in both ears that ran down her neck and into her left armpit.     ROS: Negative unless specifically indicated above in HPI.   Relevant past medical history reviewed and updated as indicated.   Allergies and medications reviewed and updated.   Current Outpatient Medications:    Cholecalciferol (VITAMIN D3) 50 MCG (2000 UT) capsule, Take 2 capsules (4,000 Units total) by mouth daily., Disp: 200 capsule, Rfl: 1   desonide (DESOWEN) 0.05 % lotion, Apply topically 2 (two) times daily., Disp: 118 mL, Rfl: 2   estradiol (ESTRACE) 0.5 MG tablet, estradiol 0.5 mg tablet  TAKE 1 TABLET BY MOUTH EVERY DAY, Disp: , Rfl:    fluticasone (FLONASE) 50 MCG/ACT nasal spray, Place 1 spray into both nostrils every morning. , Disp: , Rfl:    hyoscyamine (ANASPAZ) 0.125 MG TBDP disintergrating tablet, Place 1 tablet (0.125 mg total) under the tongue every 4 (four) hours as needed for cramping., Disp: 40 tablet, Rfl: 2   olmesartan (BENICAR) 5 MG tablet, Take 1 tablet (5 mg total) by mouth daily., Disp: 90 tablet, Rfl: 1   risedronate (ACTONEL) 35 MG tablet, TAKE 1 TABLET EVERY 7 DAYS WITH WATER ON  EMPTY STOMACH DONT LIE DOWN FOR 30 MIN, Disp: 12 tablet, Rfl: 1  Allergies  Allergen Reactions   Keflex [Cephalexin] Hives    Objective:   BP 118/80   Pulse 72   Temp 98.4 F (36.9 C)   Resp 20   Ht 5\' 3"  (1.6 m)   Wt 105 lb (47.6 kg)   LMP 12/26/2014 (Approximate)   SpO2 97%   BMI 18.60 kg/m    Physical Exam Vitals reviewed.  Constitutional:      General: She is not in acute distress.    Appearance: Normal appearance. She is not ill-appearing, toxic-appearing or diaphoretic.  HENT:     Head: Normocephalic and atraumatic.     Right Ear: Tympanic membrane, ear canal and external ear normal. There is no impacted cerumen.     Left Ear: Ear canal and external ear normal. A middle ear effusion is present. There is no impacted cerumen.     Nose: Congestion present. No rhinorrhea.     Right Sinus: Frontal sinus tenderness present. No maxillary sinus tenderness.     Left Sinus: Frontal sinus tenderness present. No maxillary sinus tenderness.     Mouth/Throat:     Mouth: Mucous membranes are moist.     Pharynx: Oropharynx is clear. No oropharyngeal exudate or posterior oropharyngeal erythema.  Eyes:     General: No scleral icterus.       Right eye: No discharge.        Left eye: No discharge.  Conjunctiva/sclera: Conjunctivae normal.  Cardiovascular:     Rate and Rhythm: Normal rate and regular rhythm.     Heart sounds: Normal heart sounds. No murmur heard.    No friction rub. No gallop.  Pulmonary:     Effort: Pulmonary effort is normal. No respiratory distress.     Breath sounds: Normal breath sounds. No stridor. No wheezing, rhonchi or rales.  Musculoskeletal:        General: Normal range of motion.     Cervical back: Normal range of motion.  Lymphadenopathy:     Cervical: No cervical adenopathy.  Skin:    General: Skin is warm and dry.     Capillary Refill: Capillary refill takes less than 2 seconds.  Neurological:     General: No focal deficit present.      Mental Status: She is alert and oriented to person, place, and time. Mental status is at baseline.  Psychiatric:        Mood and Affect: Mood normal.        Behavior: Behavior normal.        Thought Content: Thought content normal.        Judgment: Judgment normal.

## 2022-10-24 ENCOUNTER — Other Ambulatory Visit: Payer: Self-pay | Admitting: Internal Medicine

## 2022-10-24 DIAGNOSIS — Z1211 Encounter for screening for malignant neoplasm of colon: Secondary | ICD-10-CM

## 2022-10-24 DIAGNOSIS — Z1212 Encounter for screening for malignant neoplasm of rectum: Secondary | ICD-10-CM

## 2022-12-19 LAB — COLOGUARD: COLOGUARD: NEGATIVE

## 2023-02-06 ENCOUNTER — Ambulatory Visit (INDEPENDENT_AMBULATORY_CARE_PROVIDER_SITE_OTHER)
Admission: RE | Admit: 2023-02-06 | Discharge: 2023-02-06 | Disposition: A | Discharge status: Home / Self Care | Payer: Self-pay | Source: Ambulatory Visit | Attending: Internal Medicine | Admitting: Internal Medicine

## 2023-02-06 DIAGNOSIS — E559 Vitamin D deficiency, unspecified: Secondary | ICD-10-CM

## 2023-02-06 DIAGNOSIS — M810 Age-related osteoporosis without current pathological fracture: Secondary | ICD-10-CM

## 2023-02-07 ENCOUNTER — Encounter: Payer: Self-pay | Admitting: Internal Medicine

## 2023-02-07 ENCOUNTER — Other Ambulatory Visit: Payer: Self-pay | Admitting: Internal Medicine

## 2023-02-07 ENCOUNTER — Other Ambulatory Visit (HOSPITAL_COMMUNITY): Payer: Self-pay

## 2023-02-07 DIAGNOSIS — M810 Age-related osteoporosis without current pathological fracture: Secondary | ICD-10-CM

## 2023-02-07 MED ORDER — DENOSUMAB 60 MG/ML ~~LOC~~ SOSY
60.0000 mg | PREFILLED_SYRINGE | Freq: Once | SUBCUTANEOUS | 0 refills | Status: AC
Start: 2023-02-07 — End: 2023-02-10
  Filled 2023-02-07: qty 1, 1d supply, fill #0
  Filled 2023-02-10: qty 1, 180d supply, fill #0

## 2023-02-09 ENCOUNTER — Other Ambulatory Visit (HOSPITAL_COMMUNITY): Payer: Self-pay

## 2023-02-09 ENCOUNTER — Encounter (HOSPITAL_COMMUNITY): Payer: Self-pay

## 2023-02-09 ENCOUNTER — Other Ambulatory Visit: Payer: Self-pay

## 2023-02-10 ENCOUNTER — Other Ambulatory Visit: Payer: Self-pay | Admitting: Internal Medicine

## 2023-02-10 ENCOUNTER — Other Ambulatory Visit: Payer: Self-pay

## 2023-02-10 DIAGNOSIS — M810 Age-related osteoporosis without current pathological fracture: Secondary | ICD-10-CM

## 2023-02-10 MED ORDER — DENOSUMAB 60 MG/ML ~~LOC~~ SOSY: 60.0000 mg | PREFILLED_SYRINGE | Freq: Once | SUBCUTANEOUS | 0 refills | Status: AC

## 2023-02-10 NOTE — Progress Notes (Signed)
Insurance requires patient to fill [facility] with [facility]. Notified office so they can send prescription. Dis-enrolling

## 2023-02-22 ENCOUNTER — Other Ambulatory Visit: Payer: Self-pay | Admitting: Internal Medicine

## 2023-02-22 DIAGNOSIS — M810 Age-related osteoporosis without current pathological fracture: Secondary | ICD-10-CM

## 2023-02-26 ENCOUNTER — Other Ambulatory Visit (HOSPITAL_COMMUNITY): Payer: Self-pay

## 2023-02-26 ENCOUNTER — Telehealth: Payer: Self-pay

## 2023-02-26 NOTE — Telephone Encounter (Signed)
 Prolia VOB initiated via [email or web address]

## 2023-03-03 NOTE — Telephone Encounter (Signed)
[name] Kitchen

## 2023-03-04 ENCOUNTER — Other Ambulatory Visit: Payer: Self-pay | Admitting: Internal Medicine

## 2023-03-04 DIAGNOSIS — I10 Essential (primary) hypertension: Secondary | ICD-10-CM

## 2023-03-12 ENCOUNTER — Other Ambulatory Visit (HOSPITAL_COMMUNITY): Payer: Self-pay

## 2023-03-12 NOTE — Telephone Encounter (Signed)
Pharmacy Patient Advocate Encounter   Received notification from  [employer] Portal that prior authorization for Prolia is required/requested.   Insurance verification completed.   The patient is insured through [employer] .   Per test claim: PA required; PA submitted to above mentioned insurance via [employer] Key/confirmation #/[id number] Status is pending

## 2023-03-30 ENCOUNTER — Other Ambulatory Visit (HOSPITAL_COMMUNITY): Payer: Self-pay

## 2023-03-30 NOTE — Telephone Encounter (Signed)
 Pharmacy Patient Advocate Encounter  Received notification from [facility] that Prior Authorization for PROLIA has been APPROVED from 03/30/23 to 03/29/24   PA #/Case ID/Reference #: [id number]   *Must fill at specialty pharmacy*

## 2023-03-30 NOTE — Telephone Encounter (Deleted)
 Pt ready for scheduling for PROLIA on or after : 03/30/23  Option# 1: Buy/Bill (Office supplied medication)  Out-of-pocket cost due at time of clinic visit: $160  Number of injection/visits approved: 2  Primary: [employer] Prolia co-insurance: $80 Admin fee co-insurance: $80  Secondary: --- Prolia co-insurance:  Admin fee co-insurance:   Medical Benefit Details: Date Benefits were checked: 02/27/23 Deductible: NO/ Coinsurance: $80/ Admin Fee: $80  Prior Auth: APPROVED PA# [id number] Expiration Date: 03/12/23-03/10/24  # of doses approved: 2 ----------------------------------------------------------------------- Option# 2- Med Obtained from pharmacy:  Pharmacy benefit: Copay $*** (Paid to pharmacy) Admin Fee: *** (Pay at clinic)  Prior Auth: *** PA# Expiration Date:   # of doses approved:   If patient wants fill through the pharmacy benefit please send prescription to: {[email or web address]:[id number]}, and include estimated need by date in rx notes. Pharmacy will ship medication directly to the office.  Patient *** eligible for Prolia Copay Card. Copay Card can make patient's cost as little as $25. Link to apply: [email or web address]://[email or web address].[email or web address]  ** This summary of benefits is an estimation of the patient's out-of-pocket cost. Exact cost may very based on individual plan coverage.

## 2023-03-30 NOTE — Telephone Encounter (Signed)
 Pt ready for scheduling for PROLIA on or after : 03/30/23  Option# 1: Buy/Bill (Office supplied medication)  Out-of-pocket cost due at time of clinic visit: $160  Number of injection/visits approved: 2  Primary: [employer] Prolia co-insurance: $80 Admin fee co-insurance: $80  Secondary: --- Prolia co-insurance:  Admin fee co-insurance:   Medical Benefit Details: Date Benefits were checked: 02/27/23 Deductible: NO/ Coinsurance: $80/ Admin Fee: $80  Prior Auth: APPROVED PA# [id number] Expiration Date: 03/12/23-03/11/23  # of doses approved: 2 ----------------------------------------------------------------------- Option# 2- Med Obtained from pharmacy: - MUST FILL AT SPECIALTY PHARMACY  Pharmacy benefit: Copay $--- (Paid to pharmacy) Admin Fee: --- (Pay at clinic)  Prior Auth: APPROVED PA# [id number] Expiration Date: 03/30/23-03/29/24  # of doses approved: 2   If patient wants fill through the pharmacy benefit please send prescription to: [facility], and include estimated need by date in rx notes. Pharmacy will ship medication directly to the office.  Patient IS eligible for Prolia Copay Card. Copay Card can make patient's cost as little as $25. Link to apply: [email or web address]://[email or web address].[email or web address]  ** This summary of benefits is an estimation of the patient's out-of-pocket cost. Exact cost may very based on individual plan coverage.

## 2023-04-06 ENCOUNTER — Telehealth: Payer: Self-pay

## 2023-04-06 NOTE — Telephone Encounter (Signed)
 Called and spoke with patient. Informed them of their OOP as well as scheduled them for their prolia injection.

## 2023-04-06 NOTE — Telephone Encounter (Signed)
 Copied from CRM [id number]. Topic: General - Other >> Apr 03, 2023  4:21 PM Truddie Crumble wrote: Reason for CRM: patient called wanting to know if her prolia has been approved

## 2023-04-06 NOTE — Telephone Encounter (Signed)
 Patient is cleared for prolia injection on 03/30/2023 per PA information on 02/26/2023.  Mentioned in provider note last on 02/06/2023 (Bone Density Scan report). Medication is clinic supplied. CoPay:$160

## 2023-04-07 NOTE — Telephone Encounter (Signed)
 Patient has been scheduled

## 2023-04-22 ENCOUNTER — Encounter: Payer: Self-pay | Admitting: Family Medicine

## 2023-04-22 ENCOUNTER — Ambulatory Visit (INDEPENDENT_AMBULATORY_CARE_PROVIDER_SITE_OTHER): Admitting: Family Medicine

## 2023-04-22 VITALS — BP 112/72 | HR 87 | Temp 98.5°F | Ht 63.0 in | Wt 104.4 lb

## 2023-04-22 DIAGNOSIS — J029 Acute pharyngitis, unspecified: Secondary | ICD-10-CM | POA: Diagnosis not present

## 2023-04-22 DIAGNOSIS — J301 Allergic rhinitis due to pollen: Secondary | ICD-10-CM

## 2023-04-22 LAB — POCT RAPID STREP A (OFFICE): Rapid Strep A Screen: NEGATIVE

## 2023-04-22 LAB — POC COVID19 BINAXNOW: SARS Coronavirus 2 Ag: NEGATIVE

## 2023-04-22 NOTE — Progress Notes (Signed)
    Acute Office Visit  Subjective:     Patient ID: [name], female    DOB: 1965-06-30, 58 y.o.   MRN: [id number]  Chief Complaint  Patient presents with   Acute Visit    Ongoing since Monday, sore throat and cough. Dry cough, has tried Robitussin    HPI Patient is in today for evaluation of sore throat, cough, postnasal drip, ear pressure, for the last 2 days.  Reports temperature of 99.8 this morning.  Has been taking Robitussin and Tylenol, Flonase. States that her children were sick a couple of weeks ago with similar symptoms. Denies abdominal pain, nausea, vomiting, diarrhea, rash, chills, other symptoms.  Medical hx as outlined below.  ROS Per HPI      Objective:    BP 112/72 (BP Location: Left Arm, Patient Position: Sitting)   Pulse 87   Temp 98.5 F (36.9 C) (Temporal)   Ht 5\' 3"  (1.6 m)   Wt 104 lb 6.4 oz (47.4 kg)   LMP 12/26/2014 (Approximate)   SpO2 96%   BMI 18.49 kg/m    Physical Exam Vitals and nursing note reviewed.  Constitutional:      General: She is not in acute distress.    Comments: Appears fatigued  HENT:     Head: Normocephalic and atraumatic.     Right Ear: External ear normal. A middle ear effusion is present. Tympanic membrane is not erythematous, retracted or bulging.     Left Ear: External ear normal. A middle ear effusion is present. Tympanic membrane is not erythematous, retracted or bulging.     Nose: Rhinorrhea present. No congestion.     Right Turbinates: Pale.     Left Turbinates: Pale.     Mouth/Throat:     Mouth: Mucous membranes are moist.     Pharynx: Oropharynx is clear. No oropharyngeal exudate or posterior oropharyngeal erythema.     Comments: Oropharyngeal cobblestoning   Eyes:     Extraocular Movements: Extraocular movements intact.  Cardiovascular:     Rate and Rhythm: Normal rate and regular rhythm.     Heart sounds: Normal heart sounds.  Pulmonary:     Effort: Pulmonary effort is normal. No  respiratory distress.     Breath sounds: No wheezing, rhonchi or rales.  Musculoskeletal:     Cervical back: Normal range of motion and neck supple.  Lymphadenopathy:     Cervical: Cervical adenopathy present.  Skin:    General: Skin is warm and dry.  Neurological:     General: No focal deficit present.     Mental Status: She is alert and oriented to person, place, and time.     Results for orders placed or performed in visit on 04/22/23  POC COVID-19 BinaxNow  Result Value Ref Range   SARS Coronavirus 2 Ag Negative Negative  POCT rapid strep A  Result Value Ref Range   Rapid Strep A Screen Negative Negative        Assessment & Plan:   Sore throat -     POC COVID-19 BinaxNow -     POCT rapid strep A  Seasonal allergic rhinitis due to pollen  COVID and strep neg Continue Flonase, Tylenol, Robitussin Add antihistamine like Zyrtec once daily Follow-up with me if symptoms are not improving by Friday  No orders of the defined types were placed in this encounter.   Return for as needed Friday.  Sherald Barge, FNP

## 2023-04-22 NOTE — Patient Instructions (Signed)
 Continue Flonase, Tylenol, Robitussin Add antihistamine like Zyrtec once daily Follow-up with me if symptoms are not improving by Friday

## 2023-04-23 ENCOUNTER — Ambulatory Visit: Payer: Self-pay

## 2023-04-30 ENCOUNTER — Ambulatory Visit: Payer: Self-pay

## 2023-04-30 DIAGNOSIS — M810 Age-related osteoporosis without current pathological fracture: Secondary | ICD-10-CM

## 2023-04-30 MED ORDER — DENOSUMAB 60 MG/ML ~~LOC~~ SOSY
60.0000 mg | PREFILLED_SYRINGE | Freq: Once | SUBCUTANEOUS | Status: AC
Start: 2023-04-30 — End: 2023-04-30
  Administered 2023-04-30: 60 mg via SUBCUTANEOUS

## 2023-04-30 NOTE — Progress Notes (Signed)
 Patient visits today for their prolia injection. Patient informed of what they received and tolerated injection well. Patient notified to reach out to office if needed.

## 2023-06-08 ENCOUNTER — Other Ambulatory Visit (HOSPITAL_BASED_OUTPATIENT_CLINIC_OR_DEPARTMENT_OTHER): Payer: Self-pay

## 2023-08-03 ENCOUNTER — Other Ambulatory Visit: Payer: Self-pay | Admitting: Obstetrics and Gynecology

## 2023-08-03 DIAGNOSIS — R928 Other abnormal and inconclusive findings on diagnostic imaging of breast: Secondary | ICD-10-CM

## 2023-08-11 ENCOUNTER — Encounter

## 2023-08-21 ENCOUNTER — Ambulatory Visit
Admission: RE | Admit: 2023-08-21 | Discharge: 2023-08-21 | Disposition: A | Discharge status: Home / Self Care | Source: Ambulatory Visit | Attending: Obstetrics and Gynecology | Admitting: Obstetrics and Gynecology

## 2023-08-21 ENCOUNTER — Other Ambulatory Visit: Payer: Self-pay | Admitting: Obstetrics and Gynecology

## 2023-08-21 DIAGNOSIS — R921 Mammographic calcification found on diagnostic imaging of breast: Secondary | ICD-10-CM

## 2023-08-21 DIAGNOSIS — R928 Other abnormal and inconclusive findings on diagnostic imaging of breast: Secondary | ICD-10-CM

## 2023-08-26 ENCOUNTER — Ambulatory Visit
Admission: RE | Admit: 2023-08-26 | Discharge: 2023-08-26 | Disposition: A | Discharge status: Home / Self Care | Source: Ambulatory Visit | Attending: Obstetrics and Gynecology | Admitting: Obstetrics and Gynecology

## 2023-08-26 DIAGNOSIS — R921 Mammographic calcification found on diagnostic imaging of breast: Secondary | ICD-10-CM

## 2023-08-26 DIAGNOSIS — R928 Other abnormal and inconclusive findings on diagnostic imaging of breast: Secondary | ICD-10-CM

## 2023-08-28 ENCOUNTER — Other Ambulatory Visit: Payer: Self-pay | Admitting: Obstetrics and Gynecology

## 2023-08-28 DIAGNOSIS — R921 Mammographic calcification found on diagnostic imaging of breast: Secondary | ICD-10-CM

## 2023-09-01 ENCOUNTER — Ambulatory Visit: Payer: Self-pay

## 2023-09-01 NOTE — Telephone Encounter (Signed)
 FYI Only or Action Required?: FYI only for provider.  Patient was last seen in primary care on 04/22/2023 by Alvia Corean CROME, FNP.  Called Nurse Triage reporting Dizziness.  Symptoms began several weeks ago.  Interventions attempted: OTC medications: bc powder.  Symptoms are: gradually worsening.  Triage Disposition: See PCP When Office is Open (Within 3 Days)  Patient/caregiver understands and will follow disposition?: Yes    Copied from CRM #[id number]. Topic: Clinical - Red Word Triage >> Sep 01, 2023  9:41 AM Robinson H wrote: [facility] that prompted transfer to Nurse Triage: Dizzy spells, after the one yesterday had a headache for 10 minutes and flushed Reason for Disposition  [1] MILD dizziness (e.g., vertigo; walking normally) AND [2] has NOT been evaluated by doctor (or NP/PA) for this  Answer Assessment - Initial Assessment Questions 1. DESCRIPTION: Describe your dizziness.     Felt the room spin 2. VERTIGO: Do you feel like either you or the room is spinning or tilting?      Yes the room  3. LIGHTHEADED: Do you feel lightheaded? (e.g., somewhat faint, woozy, weak upon standing)     no 4. SEVERITY: How bad is it?  Can you walk?     Off balance 5. ONSET:  When did the dizziness begin?     Several weeks ago  6. AGGRAVATING FACTORS: Does anything make it worse? (e.g., standing, change in head position)     Changing position 7. CAUSE: What do you think is causing the dizziness?     unknown 8. RECURRENT SYMPTOM: Have you had dizziness before? If Yes, ask: When was the last time? What happened that time?     Has been occurring for the last couple weeks 9. OTHER SYMPTOMS: Do you have any other symptoms? (e.g., earache, headache, numbness, tinnitus, vomiting, weakness)     Headache, flush cheeks and forehead  Protocols used: Dizziness - Vertigo-A-AH

## 2023-09-02 ENCOUNTER — Encounter: Payer: Self-pay | Admitting: Family Medicine

## 2023-09-02 ENCOUNTER — Ambulatory Visit: Payer: Self-pay | Admitting: Family Medicine

## 2023-09-02 ENCOUNTER — Ambulatory Visit: Admitting: Family Medicine

## 2023-09-02 VITALS — BP 132/84 | HR 91 | Temp 97.9°F | Ht 63.0 in | Wt 110.0 lb

## 2023-09-02 DIAGNOSIS — R42 Dizziness and giddiness: Secondary | ICD-10-CM | POA: Diagnosis not present

## 2023-09-02 DIAGNOSIS — Z9109 Other allergy status, other than to drugs and biological substances: Secondary | ICD-10-CM | POA: Diagnosis not present

## 2023-09-02 DIAGNOSIS — G44209 Tension-type headache, unspecified, not intractable: Secondary | ICD-10-CM

## 2023-09-02 LAB — CBC WITH DIFFERENTIAL/PLATELET
Basophils Absolute: 0 K/uL (ref 0.0–0.1)
Basophils Relative: 0.7 % (ref 0.0–3.0)
Eosinophils Absolute: 0.2 K/uL (ref 0.0–0.7)
Eosinophils Relative: 3.8 % (ref 0.0–5.0)
HCT: 39.4 % (ref 36.0–46.0)
Hemoglobin: 12.7 g/dL (ref 12.0–15.0)
Lymphocytes Relative: 28.7 % (ref 12.0–46.0)
Lymphs Abs: 1.5 K/uL (ref 0.7–4.0)
MCHC: 32.3 g/dL (ref 30.0–36.0)
MCV: 87.5 fl (ref 78.0–100.0)
Monocytes Absolute: 0.5 K/uL (ref 0.1–1.0)
Monocytes Relative: 8.8 % (ref 3.0–12.0)
Neutro Abs: 3 K/uL (ref 1.4–7.7)
Neutrophils Relative %: 58 % (ref 43.0–77.0)
Platelets: 270 K/uL (ref 150.0–400.0)
RBC: 4.51 Mil/uL (ref 3.87–5.11)
RDW: 13.2 % (ref 11.5–15.5)
WBC: 5.2 K/uL (ref 4.0–10.5)

## 2023-09-02 LAB — COMPREHENSIVE METABOLIC PANEL WITH GFR
ALT: 10 U/L (ref 0–35)
AST: 14 U/L (ref 0–37)
Albumin: 4.4 g/dL (ref 3.5–5.2)
Alkaline Phosphatase: 46 U/L (ref 39–117)
BUN: 14 mg/dL (ref 6–23)
CO2: 28 meq/L (ref 19–32)
Calcium: 9.1 mg/dL (ref 8.4–10.5)
Chloride: 105 meq/L (ref 96–112)
Creatinine, Ser: 0.64 mg/dL (ref 0.40–1.20)
GFR: 98.29 mL/min (ref >60.00)
Glucose, Bld: 97 mg/dL (ref 70–99)
Potassium: 4.5 meq/L (ref 3.5–5.1)
Sodium: 138 meq/L (ref 135–145)
Total Bilirubin: 0.5 mg/dL (ref 0.2–1.2)
Total Protein: 6.9 g/dL (ref 6.0–8.3)

## 2023-09-02 LAB — TSH: TSH: 1.37 u[IU]/mL (ref 0.35–5.50)

## 2023-09-02 MED ORDER — MECLIZINE HCL 25 MG PO TABS: 25.0000 mg | ORAL_TABLET | Freq: Two times a day (BID) | ORAL | 0 refills | Status: AC | PRN

## 2023-09-02 NOTE — Patient Instructions (Signed)
 Please go downstairs for labs before you leave.  As discussed, you can try meclizine  25 mg if you have another episode of dizziness.  Treat your allergies with Flonase and consider taking Zyrtec or Xyzal.  Stay well-hydrated  Follow-up if you have any new or worsening symptoms

## 2023-09-02 NOTE — Progress Notes (Signed)
 Subjective:     Patient ID: [name], female    DOB: 13-Mar-1965, 58 y.o.   MRN: [id number]  Chief Complaint  Patient presents with   Dizziness    Going on last couple of weeks, recent episode Monday evening and she was eating dinner then got off balance w headache for about 10 mins leaving the restaurant. Was flushed in the face    Dizziness Associated symptoms include headaches. Pertinent negatives include no abdominal pain, chest pain, chills, congestion, coughing, fever, nausea or vomiting.    Discussed the use of AI scribe software for clinical note transcription with the patient, who gave verbal consent to proceed.  History of Present Illness [name] is a 58 year old female who presents with dizziness and headache.  Dizziness and disequilibrium - Episodes of dizziness, often triggered by standing up quickly (e.g., rising from a seated position, washing hair) - Duration of episodes varies; one episode lasted most of the day - Sensation described as 'off balance' rather than vertigo - No loss of consciousness - On Monday evening, significant dizziness occurred upon standing, persisting during a thirty-minute car ride - No associated vision changes, nausea, vomiting, numbness, tingling, or weakness - Past dizziness episodes were brief and not associated with headache - History of short-lived vertigo a couple of years ago  Headache - Intense, sharp headache across the frontal region with pressure above the eyes during Monday evening dizziness episode - Headache resolved spontaneously within ten minutes - Headache differed from prior migraines and usual headaches - History of headaches, including migraines  Autonomic symptoms - Flushed sensation with hot cheeks during Monday evening episode - No diaphoresis  Ear, nose, and throat symptoms - No ear pain, pressure, or tinnitus - Regular use of Flonase - Recent exposure to dust while cleaning  parents' house  Hydration and medication history - Feels well-hydrated - Discontinued olmesartan  around October last year  Associated symptoms and review of systems - No urinary symptoms, abdominal pain, or chest pain      Health Maintenance Due  Topic Date Due   Hepatitis B Vaccines (1 of 3 - 19+ 3-dose series) Never done   Pneumococcal Vaccine: 50+ Years (1 of 1 - PCV) Never done   COVID-19 Vaccine (1 - 2024-25 season) Never done   Cervical Cancer Screening (HPV/Pap Cotest)  03/24/2023   INFLUENZA VACCINE  08/21/2023    Past Medical History:  Diagnosis Date   Allergic rhinitis    Bilateral ureteral calculi    History of nephrolithiasis    Systolic murmur    PER PCP NOTE 10/ 2016--  asymptomatic   Wears glasses     Past Surgical History:  Procedure Laterality Date   ACHILLES TENDON LENGTHENING Bilateral age 80   CESAREAN SECTION  10-04-2005  &  03-20-2009   Bilateral Tubal Ligation w/ last one   CYSTOSCOPY WITH HOLMIUM LASER LITHOTRIPSY Bilateral 01/30/2015   Procedure: CYSTOSCOPY WITH HOLMIUM LASER LITHOTRIPSY;  Surgeon: Arlena Gal, MD;  Location: [facility];  Service: Urology;  Laterality: Bilateral;   CYSTOSCOPY WITH RETROGRADE PYELOGRAM, URETEROSCOPY AND STENT PLACEMENT Bilateral 01/30/2015   Procedure: CYSTOSCOPY WITH RETROGRADE PYELOGRAM, URETEROSCOPY AND STENT PLACEMENT;  Surgeon: Arlena Gal, MD;  Location: [facility];  Service: Urology;  Laterality: Bilateral;   STONE EXTRACTION WITH BASKET Bilateral 01/30/2015   Procedure: STONE EXTRACTION WITH BASKET;  Surgeon: Arlena Gal, MD;  Location: [facility];  Service: Urology;  Laterality: Bilateral;  Family History  Problem Relation Age of Onset   Breast cancer Mother    Hypertension Father    COPD Father    Diabetes Neg Hx    Early death Neg Hx    Heart disease Neg Hx    Hyperlipidemia Neg Hx    Stroke Neg Hx    Colon cancer Neg Hx     Alcohol abuse Neg Hx     Social History   Socioeconomic History   Marital status: Married    Spouse name: Not on file   Number of children: Not on file   Years of education: Not on file   Highest education level: Not on file  Occupational History   Not on file  Tobacco Use   Smoking status: Never    Passive exposure: Never   Smokeless tobacco: Never  Substance and Sexual Activity   Alcohol use: No   Drug use: No   Sexual activity: Yes    Partners: Male  Other Topics Concern   Not on file  Social History Narrative   Not on file   Social Drivers of Health   Financial Resource Strain: Not on file  Food Insecurity: Not on file  Transportation Needs: Not on file  Physical Activity: Not on file  Stress: Not on file  Social Connections: Not on file  Intimate Partner Violence: Not on file    Outpatient Medications Prior to Visit  Medication Sig Dispense Refill   Cholecalciferol  (VITAMIN D3) 50 MCG (2000 UT) capsule Take 2 capsules (4,000 Units total) by mouth daily. 200 capsule 1   desonide  (DESOWEN ) 0.05 % lotion Apply topically 2 (two) times daily. 118 mL 2   estradiol (ESTRACE) 0.5 MG tablet estradiol 0.5 mg tablet  TAKE 1 TABLET BY MOUTH EVERY DAY     fluticasone (FLONASE) 50 MCG/ACT nasal spray Place 1 spray into both nostrils every morning.      hyoscyamine  (ANASPAZ ) 0.125 MG TBDP disintergrating tablet Place 1 tablet (0.125 mg total) under the tongue every 4 (four) hours as needed for cramping. 40 tablet 2   olmesartan  (BENICAR ) 5 MG tablet Take 1 tablet (5 mg total) by mouth daily. 90 tablet 1   No facility-administered medications prior to visit.    Allergies  Allergen Reactions   Keflex [Cephalexin] Hives    Review of Systems  Constitutional:  Negative for chills, fever and malaise/fatigue.  HENT:  Negative for congestion, hearing loss, sinus pain and tinnitus.   Eyes:  Negative for blurred vision, double vision and photophobia.  Respiratory:  Negative  for cough and shortness of breath.   Cardiovascular:  Negative for chest pain, palpitations and leg swelling.  Gastrointestinal:  Negative for abdominal pain, constipation, diarrhea, nausea and vomiting.  Genitourinary:  Negative for dysuria, frequency and urgency.  Musculoskeletal:  Negative for falls.  Neurological:  Positive for dizziness and headaches. Negative for tingling, focal weakness and loss of consciousness.       Objective:    Physical Exam Constitutional:      General: She is not in acute distress.    Appearance: She is not ill-appearing.  HENT:     Right Ear: Tympanic membrane and ear canal normal.     Left Ear: Tympanic membrane and ear canal normal.     Nose: Nose normal.     Mouth/Throat:     Mouth: Mucous membranes are moist.     Pharynx: Oropharynx is clear.  Eyes:     Extraocular Movements: Extraocular movements  intact.     Conjunctiva/sclera: Conjunctivae normal.  Cardiovascular:     Rate and Rhythm: Normal rate and regular rhythm.     Heart sounds: Murmur heard.  Pulmonary:     Effort: Pulmonary effort is normal.     Breath sounds: Normal breath sounds.  Musculoskeletal:        General: Normal range of motion.     Cervical back: Normal range of motion and neck supple.     Right lower leg: No edema.     Left lower leg: No edema.  Skin:    General: Skin is warm and dry.  Neurological:     General: No focal deficit present.     Mental Status: She is alert and oriented to person, place, and time.     Cranial Nerves: No cranial nerve deficit.     Sensory: No sensory deficit.     Motor: No weakness or pronator drift.     Coordination: Coordination normal.     Gait: Gait normal.     Deep Tendon Reflexes: Reflexes normal.  Psychiatric:        Attention and Perception: Attention normal.        Mood and Affect: Mood normal.        Speech: Speech normal.        Behavior: Behavior normal.        Thought Content: Thought content normal.      BP  132/84   Pulse 91   Temp 97.9 F (36.6 C) (Temporal)   Ht 5' 3 (1.6 m)   Wt 110 lb (49.9 kg)   LMP 12/26/2014 (Approximate)   SpO2 97%   BMI 19.49 kg/m  Wt Readings from Last 3 Encounters:  09/02/23 110 lb (49.9 kg)  04/22/23 104 lb 6.4 oz (47.4 kg)  09/15/22 105 lb (47.6 kg)       Assessment & Plan:   Problem List Items Addressed This Visit   None Visit Diagnoses       Dizziness    -  Primary   Relevant Orders   CBC with Differential/Platelet (Completed)   Comprehensive metabolic panel with GFR (Completed)   TSH (Completed)     Acute non intractable tension-type headache         Environmental allergies          Assessment and Plan Assessment & Plan Dizziness and headache Intermittent dizziness and headache episodes, with the most recent episode occurring two days ago. Dizziness often occurs upon standing quickly, associated with imbalance but not vertigo. Recent headache was intense, sharp, frontal, lasting ten minutes without medication. No loss of consciousness, vision changes, nausea, vomiting, numbness, or tingling. No orthostatic hypotension or dehydration. Differential diagnosis includes vertigo, possibly related to inner ear issues or allergies. No current indication for brain imaging unless symptoms recur. She prefers to wait and see if symptoms return before considering brain imaging. - Order blood work to check electrolytes and blood counts. - Prescribe meclizine  25 mg for dizziness - Consider brain imaging if symptoms return and persist.  Allergic rhinitis Chronic allergic rhinitis with recent dust exposure while cleaning out parents' house. Managed with Flonase nasal spray used regularly in the mornings. No recent nasal congestion or ear symptoms. - Continue Flonase nasal spray as currently prescribed.    I am having Yunique Dearcos. Golda Cower start on meclizine . I am also having her maintain her fluticasone, estradiol, desonide , hyoscyamine , Vitamin D3, and  olmesartan .  Meds ordered this encounter  Medications  meclizine (ANTIVERT) 25 MG tablet    Sig: Take 1 tablet (25 mg total) by mouth 2 (two) times daily as needed for dizziness.    Dispense:  30 tablet    Refill:  0    Supervising Provider:   ROLLENE NORRIS A [[id number]]

## 2023-10-30 ENCOUNTER — Other Ambulatory Visit: Payer: Self-pay

## 2023-10-30 DIAGNOSIS — M810 Age-related osteoporosis without current pathological fracture: Secondary | ICD-10-CM

## 2023-10-30 MED ORDER — DENOSUMAB 60 MG/ML ~~LOC~~ SOSY
60.0000 mg | PREFILLED_SYRINGE | Freq: Once | SUBCUTANEOUS | Status: AC
  Administered 2023-11-23: 60 mg via SUBCUTANEOUS

## 2023-10-30 NOTE — Progress Notes (Signed)
 Patient due for Prolia  week of 11/02/2023, waiting on PA approval.

## 2023-11-02 ENCOUNTER — Other Ambulatory Visit (HOSPITAL_COMMUNITY): Payer: Self-pay

## 2023-11-02 ENCOUNTER — Telehealth: Payer: Self-pay

## 2023-11-02 NOTE — Telephone Encounter (Signed)
 SABRA

## 2023-11-02 NOTE — Telephone Encounter (Signed)
 Prolia VOB initiated via [email or web address]  Next Prolia inj DUE: NOW

## 2023-11-02 NOTE — Telephone Encounter (Signed)
 Pt ready for scheduling for PROLIA  on or after : 11/02/23  Option# 1: Buy/Bill (Office supplied medication)  Out-of-pocket cost due at time of clinic visit: $160 + DEDUCTIBLE  Number of injection/visits approved: 2  Primary: [employer] Prolia  co-insurance: $80 Admin fee co-insurance: $80  Secondary: --- Prolia  co-insurance:  Admin fee co-insurance:   Medical Benefit Details: Date Benefits were checked: 11/02/23 Deductible: $0 Met of $1250 Required/ Coinsurance: $80/ Admin Fee: $80  Prior Auth: APPROVED PA# [id number] Expiration Date: 03/12/23-03/10/24  # of doses approved: 2 ----------------------------------------------------------------------- Option# 2- Med Obtained from pharmacy:  Pharmacy benefit: Copay $--- MUST FILL AT SPECIALTY [facility] (Paid to pharmacy) Admin Fee: $80 (Pay at clinic)  Prior Auth: APPROVED PA# [id number]  Expiration Date: 03/30/23-03/29/24  # of doses approved: 2   If patient wants fill through the pharmacy benefit please send prescription to: [facility], and include estimated need by date in rx notes. Pharmacy will ship medication directly to the office.  Patient IS eligible for Prolia  Copay Card. Copay Card can make patient's cost as little as $25. Link to apply: https://www.amgensupportplus.com/copay  ** This summary of benefits is an estimation of the patient's out-of-pocket cost. Exact cost may very based on individual plan coverage.

## 2023-11-10 ENCOUNTER — Telehealth: Payer: Self-pay

## 2023-11-10 NOTE — Telephone Encounter (Signed)
 Copied from CRM [id number]. Topic: Appointments - Scheduling Inquiry for Clinic >> Nov 10, 2023  4:18 PM Nessti S wrote: Reason for CRM: patient called to schedule appt for PROLIA . She is still waiting for call back for appt.   ----------------------------------------------------------------------- From previous Reason for Contact - Scheduling: Patient/patient representative is calling to schedule an appointment. Refer to attachments for appointment information.

## 2023-11-11 NOTE — Telephone Encounter (Signed)
 Unable to reach patient. LMTRC

## 2023-11-17 NOTE — Telephone Encounter (Unsigned)
 Copied from CRM #[id number]. Topic: Appointments - Scheduling Inquiry for Clinic >> Nov 17, 2023 11:01 AM Frederich PARAS wrote: Reason for CRM: pt called to try to gt prolio shot scheduled. 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 She is open to Adin, you all can txt through  mychart in regards to time and availability for the shot

## 2023-11-20 NOTE — Telephone Encounter (Signed)
 Is this the one that you were going to handle ?

## 2023-11-23 ENCOUNTER — Ambulatory Visit

## 2023-11-23 DIAGNOSIS — M810 Age-related osteoporosis without current pathological fracture: Secondary | ICD-10-CM | POA: Diagnosis not present

## 2023-11-23 MED ORDER — DENOSUMAB 60 MG/ML ~~LOC~~ SOSY: 60.0000 mg | PREFILLED_SYRINGE | SUBCUTANEOUS | Status: AC

## 2023-11-23 NOTE — Progress Notes (Signed)
After obtaining consent, and per orders of Dr. Ronnald Ramp, injection of Prolia given by Marrian Salvage. Patient instructed to report any adverse reaction to me immediately.

## 2023-11-30 ENCOUNTER — Ambulatory Visit
Admission: RE | Admit: 2023-11-30 | Discharge: 2023-11-30 | Disposition: A | Source: Ambulatory Visit | Attending: Obstetrics and Gynecology | Admitting: Obstetrics and Gynecology

## 2023-11-30 DIAGNOSIS — R921 Mammographic calcification found on diagnostic imaging of breast: Secondary | ICD-10-CM

## 2023-12-01 ENCOUNTER — Other Ambulatory Visit: Payer: Self-pay | Admitting: Obstetrics and Gynecology

## 2023-12-01 DIAGNOSIS — R928 Other abnormal and inconclusive findings on diagnostic imaging of breast: Secondary | ICD-10-CM

## 2024-02-07 ENCOUNTER — Other Ambulatory Visit: Payer: Self-pay | Admitting: Internal Medicine
